# Patient Record
Sex: Female | Born: 1955 | Race: White | Hispanic: No | Marital: Single | Smoking: Never smoker
Health system: Southern US, Community
[De-identification: ages and names within clinical notes are randomized; demographics above are authoritative.]

## PROBLEM LIST (undated history)

## (undated) DIAGNOSIS — J40 Bronchitis, not specified as acute or chronic: Secondary | ICD-10-CM

## (undated) DIAGNOSIS — C801 Malignant (primary) neoplasm, unspecified: Secondary | ICD-10-CM

## (undated) DIAGNOSIS — N200 Calculus of kidney: Secondary | ICD-10-CM

## (undated) DIAGNOSIS — M1990 Unspecified osteoarthritis, unspecified site: Secondary | ICD-10-CM

## (undated) DIAGNOSIS — A159 Respiratory tuberculosis unspecified: Secondary | ICD-10-CM

## (undated) HISTORY — PX: BREAST SURGERY: SHX581

## (undated) HISTORY — PX: WRIST SURGERY: SHX841

## (undated) HISTORY — DX: Respiratory tuberculosis unspecified: A15.9

## (undated) HISTORY — DX: Calculus of kidney: N20.0

## (undated) HISTORY — PX: ABDOMINAL HYSTERECTOMY: SHX81

## (undated) HISTORY — DX: Unspecified osteoarthritis, unspecified site: M19.90

## (undated) HISTORY — DX: Bronchitis, not specified as acute or chronic: J40

## (undated) HISTORY — DX: Malignant (primary) neoplasm, unspecified: C80.1

---

## 1999-11-30 ENCOUNTER — Other Ambulatory Visit: Admission: RE | Admit: 1999-11-30 | Discharge: 1999-11-30 | Payer: Self-pay | Admitting: Gynecology

## 2001-09-02 ENCOUNTER — Emergency Department (HOSPITAL_COMMUNITY): Admission: AC | Admit: 2001-09-02 | Discharge: 2001-09-02 | Payer: Self-pay

## 2001-09-02 ENCOUNTER — Encounter: Payer: Self-pay | Admitting: Emergency Medicine

## 2009-01-06 ENCOUNTER — Encounter: Admission: RE | Admit: 2009-01-06 | Discharge: 2009-01-06 | Payer: Self-pay | Admitting: Infectious Diseases

## 2009-01-13 ENCOUNTER — Encounter (INDEPENDENT_AMBULATORY_CARE_PROVIDER_SITE_OTHER): Payer: Self-pay | Admitting: Diagnostic Radiology

## 2009-01-13 ENCOUNTER — Encounter: Admission: RE | Admit: 2009-01-13 | Discharge: 2009-01-13 | Payer: Self-pay | Admitting: Infectious Diseases

## 2009-01-21 ENCOUNTER — Encounter: Admission: RE | Admit: 2009-01-21 | Discharge: 2009-01-21 | Payer: Self-pay | Admitting: Infectious Diseases

## 2009-01-27 ENCOUNTER — Encounter: Admission: RE | Admit: 2009-01-27 | Discharge: 2009-01-27 | Payer: Self-pay | Admitting: General Surgery

## 2009-01-31 ENCOUNTER — Encounter: Admission: RE | Admit: 2009-01-31 | Discharge: 2009-01-31 | Payer: Self-pay | Admitting: General Surgery

## 2009-01-31 ENCOUNTER — Encounter (INDEPENDENT_AMBULATORY_CARE_PROVIDER_SITE_OTHER): Payer: Self-pay | Admitting: Diagnostic Radiology

## 2009-02-11 ENCOUNTER — Ambulatory Visit (HOSPITAL_COMMUNITY): Admission: RE | Admit: 2009-02-11 | Discharge: 2009-02-12 | Payer: Self-pay | Admitting: General Surgery

## 2009-02-11 ENCOUNTER — Encounter (INDEPENDENT_AMBULATORY_CARE_PROVIDER_SITE_OTHER): Payer: Self-pay | Admitting: General Surgery

## 2009-02-11 ENCOUNTER — Encounter: Admission: RE | Admit: 2009-02-11 | Discharge: 2009-02-11 | Payer: Self-pay | Admitting: General Surgery

## 2009-03-17 ENCOUNTER — Ambulatory Visit (HOSPITAL_BASED_OUTPATIENT_CLINIC_OR_DEPARTMENT_OTHER): Admission: RE | Admit: 2009-03-17 | Discharge: 2009-03-18 | Payer: Self-pay | Admitting: General Surgery

## 2009-03-17 ENCOUNTER — Encounter (INDEPENDENT_AMBULATORY_CARE_PROVIDER_SITE_OTHER): Payer: Self-pay | Admitting: General Surgery

## 2009-03-24 ENCOUNTER — Ambulatory Visit: Payer: Self-pay | Admitting: Oncology

## 2009-03-30 LAB — CBC WITH DIFFERENTIAL/PLATELET
BASO%: 0.2 % (ref 0.0–2.0)
LYMPH%: 20.3 % (ref 14.0–49.7)
MCHC: 34.3 g/dL (ref 31.5–36.0)
MCV: 91.6 fL (ref 79.5–101.0)
MONO#: 0.5 10*3/uL (ref 0.1–0.9)
MONO%: 7 % (ref 0.0–14.0)
Platelets: 196 10*3/uL (ref 145–400)
RBC: 3.49 10*6/uL — ABNORMAL LOW (ref 3.70–5.45)
RDW: 12.7 % (ref 11.2–14.5)
WBC: 6.5 10*3/uL (ref 3.9–10.3)

## 2009-03-31 LAB — COMPREHENSIVE METABOLIC PANEL
ALT: 13 U/L (ref 0–35)
Alkaline Phosphatase: 69 U/L (ref 39–117)
Potassium: 4.6 mEq/L (ref 3.5–5.3)
Sodium: 142 mEq/L (ref 135–145)
Total Bilirubin: 0.6 mg/dL (ref 0.3–1.2)
Total Protein: 6.8 g/dL (ref 6.0–8.3)

## 2009-04-06 ENCOUNTER — Ambulatory Visit: Admission: RE | Admit: 2009-04-06 | Discharge: 2009-06-08 | Payer: Self-pay | Admitting: Radiation Oncology

## 2009-04-08 ENCOUNTER — Encounter: Admission: RE | Admit: 2009-04-08 | Discharge: 2009-04-08 | Payer: Self-pay | Admitting: Oncology

## 2009-04-14 ENCOUNTER — Ambulatory Visit: Payer: Self-pay | Admitting: Family Medicine

## 2009-04-14 DIAGNOSIS — Z853 Personal history of malignant neoplasm of breast: Secondary | ICD-10-CM

## 2009-06-21 ENCOUNTER — Ambulatory Visit: Payer: Self-pay | Admitting: Oncology

## 2009-06-23 LAB — CBC & DIFF AND RETIC
Eosinophils Absolute: 0.2 10*3/uL (ref 0.0–0.5)
MONO#: 0.3 10*3/uL (ref 0.1–0.9)
MONO%: 9.6 % (ref 0.0–14.0)
NEUT#: 1.7 10*3/uL (ref 1.5–6.5)
RBC: 3.87 10*6/uL (ref 3.70–5.45)
RDW: 12.3 % (ref 11.2–14.5)
Retic %: 0.85 % (ref 0.50–1.50)
Retic Ct Abs: 32.9 10*3/uL (ref 18.30–72.70)
WBC: 3.5 10*3/uL — ABNORMAL LOW (ref 3.9–10.3)

## 2009-06-23 LAB — CHCC SMEAR

## 2009-06-24 LAB — FERRITIN: Ferritin: 29 ng/mL (ref 10–291)

## 2009-06-24 LAB — IRON AND TIBC: Iron: 43 ug/dL (ref 42–145)

## 2009-09-08 ENCOUNTER — Ambulatory Visit: Payer: Self-pay | Admitting: Genetic Counselor

## 2009-12-23 ENCOUNTER — Ambulatory Visit: Payer: Self-pay | Admitting: Oncology

## 2009-12-28 LAB — CBC & DIFF AND RETIC
Basophils Absolute: 0 10*3/uL (ref 0.0–0.1)
EOS%: 5.4 % (ref 0.0–7.0)
Eosinophils Absolute: 0.3 10*3/uL (ref 0.0–0.5)
HGB: 12.6 g/dL (ref 11.6–15.9)
LYMPH%: 29.9 % (ref 14.0–49.7)
MCH: 31 pg (ref 25.1–34.0)
MCV: 92.1 fL (ref 79.5–101.0)
MONO%: 7.2 % (ref 0.0–14.0)
NEUT#: 3 10*3/uL (ref 1.5–6.5)
Platelets: 163 10*3/uL (ref 145–400)
RBC: 4.07 10*6/uL (ref 3.70–5.45)
RDW: 12.2 % (ref 11.2–14.5)
Retic %: 0.99 % (ref 0.50–1.50)

## 2009-12-28 LAB — COMPREHENSIVE METABOLIC PANEL
Albumin: 4 g/dL (ref 3.5–5.2)
BUN: 9 mg/dL (ref 6–23)
CO2: 32 mEq/L (ref 19–32)
Calcium: 9.5 mg/dL (ref 8.4–10.5)
Glucose, Bld: 89 mg/dL (ref 70–99)
Potassium: 4 mEq/L (ref 3.5–5.3)
Sodium: 141 mEq/L (ref 135–145)
Total Protein: 7 g/dL (ref 6.0–8.3)

## 2009-12-28 LAB — LACTATE DEHYDROGENASE: LDH: 146 U/L (ref 94–250)

## 2009-12-28 LAB — IRON AND TIBC
Iron: 68 ug/dL (ref 42–145)
UIBC: 272 ug/dL

## 2009-12-28 LAB — FERRITIN: Ferritin: 40 ng/mL (ref 10–291)

## 2010-01-27 ENCOUNTER — Ambulatory Visit: Payer: Self-pay | Admitting: Oncology

## 2010-11-28 ENCOUNTER — Telehealth: Payer: Self-pay | Admitting: Family Medicine

## 2010-11-28 ENCOUNTER — Ambulatory Visit
Admission: RE | Admit: 2010-11-28 | Discharge: 2010-11-28 | Payer: Self-pay | Source: Home / Self Care | Attending: Family Medicine | Admitting: Family Medicine

## 2010-11-28 DIAGNOSIS — Z87448 Personal history of other diseases of urinary system: Secondary | ICD-10-CM | POA: Insufficient documentation

## 2010-11-28 DIAGNOSIS — M549 Dorsalgia, unspecified: Secondary | ICD-10-CM | POA: Insufficient documentation

## 2010-11-28 DIAGNOSIS — N209 Urinary calculus, unspecified: Secondary | ICD-10-CM | POA: Insufficient documentation

## 2010-11-28 LAB — CONVERTED CEMR LAB
Nitrite: NEGATIVE
Specific Gravity, Urine: 1.025
WBC Urine, dipstick: NEGATIVE
pH: 6

## 2010-11-30 ENCOUNTER — Ambulatory Visit
Admission: RE | Admit: 2010-11-30 | Discharge: 2010-11-30 | Payer: Self-pay | Source: Home / Self Care | Attending: Family Medicine | Admitting: Family Medicine

## 2010-11-30 LAB — CONVERTED CEMR LAB
Nitrite: NEGATIVE
Specific Gravity, Urine: 1.02
Urobilinogen, UA: 0.2

## 2010-12-07 ENCOUNTER — Telehealth: Payer: Self-pay | Admitting: Family Medicine

## 2010-12-07 DIAGNOSIS — M549 Dorsalgia, unspecified: Secondary | ICD-10-CM

## 2010-12-07 NOTE — Assessment & Plan Note (Signed)
Summary: Recheck/cb/pt rescd//ccm   Vital Signs:  Patient profile:   55 year old female Menstrual status:  hysterectomy Weight:      128 pounds Temp:     98.9 degrees F oral BP sitting:   120 / 80  (left arm) Cuff size:   regular  Vitals Entered By: Kern Reap CMA Duncan Dull) (November 30, 2010 11:38 AM) CC: follow-up visit   CC:  follow-up visit.  History of Present Illness: Candace Carlson is a 55 year old, married female, who comes in today for follow-up of back pain.  When we saw her initially she had hematuria.  We felt that she also had in addition to some lumbar disease.  A small kidney stone.  She's been drinking lots of liquids.  Urinalysis clear.  Her back pain has persisted.  It comes and goes.  It occurs about 4 to 56 times per day and lasts anywhere from 15 to 45 minutes.  She states when it comes on it.  The combination of sharp and dull, and she points to the left SI area as the source for pain.  Intensity wise.  She says is a 10.  It radiates down the left lateral side of her leg to her calf.  Neurologic review of systems negative.  She states the only other time.  She had pain like this was when she was pregnant.  One she delivered the pain when away,. but the pain was very similar to what she is having now   Allergies: No Known Drug Allergies  Social History: Reviewed history from 04/14/2009 and no changes required. Occupation: Conservation officer, nature, [employer] Divorced Never Smoked Alcohol use-no Drug use-no  Review of Systems      See HPI  Physical Exam  General:  Well-developed,well-nourished,in no acute distress; alert,appropriate and cooperative throughout examination Msk:  No deformity or scoliosis noted of thoracic or lumbar spine.   Pulses:  R and L carotid,radial,femoral,dorsalis pedis and posterior tibial pulses are full and equal bilaterally Extremities:  No clubbing, cyanosis, edema, or deformity noted with normal full range of motion of all joints.   Neurologic:  No  cranial nerve deficits noted. Station and gait are normal. Plantar reflexes are down-going bilaterally. DTRs are symmetrical throughout. Sensory, motor and coordinative functions appear intact.   Impression & Recommendations:  Problem # 1:  BACK PAIN (ICD-724.5) Assessment Unchanged  The following medications were removed from the medication list:    Hydrocodone-acetaminophen 5-500 Mg Tabs (Hydrocodone-acetaminophen) .Marland Kitchen... Take one to two tabs every four hours as needed pain Her updated medication list for this problem includes:    Vicodin Es 7.5-750 Mg Tabs (Hydrocodone-acetaminophen) .Marland Kitchen... 1/2 to 1 by mouth qid as needed pain  Orders: T-Lumbar Spine w/Flex & Ext 4 Views (21308MV) Physical Therapy Referral (PT)  Problem # 2:  HEMATURIA, HX OF (ICD-V13.09) Assessment: Improved  Orders: UA Dipstick w/o Micro (automated)  (81003)  Complete Medication List: 1)  Vicodin Es 7.5-750 Mg Tabs (Hydrocodone-acetaminophen) .... 1/2 to 1 by mouth qid as needed pain  Patient Instructions: 1)   take 600 mg of Motrin twice daily with food, and a half of a Vicodin p.r.n. for severe back pain. 2)  Go to the main office now for some x-rays of the spine. 3)  We will get she set up for physical therapy ASAP. 4)  Return to see me in two weeks for follow-up   Orders Added: 1)  UA Dipstick w/o Micro (automated)  [81003] 2)  T-Lumbar Spine w/Flex & Ext  4 Views [72120TC] 3)  Est. Patient Level III [14782] 4)  Physical Therapy Referral [PT]    Laboratory Results   Urine Tests  Date/Time Recieved: November 30, 2010 11:08 AM  Date/Time Reported: November 30, 2010 11:08 AM   Routine Urinalysis   Color: yellow Appearance: Clear Glucose: negative   (Normal Range: Negative) Bilirubin: negative   (Normal Range: Negative) Ketone: trace (5)   (Normal Range: Negative) Spec. Gravity: 1.020   (Normal Range: 1.003-1.035) Blood: negative   (Normal Range: Negative) pH: 5.0   (Normal Range:  5.0-8.0) Protein: negative   (Normal Range: Negative) Urobilinogen: 0.2   (Normal Range: 0-1) Nitrite: negative   (Normal Range: Negative) Leukocyte Esterace: trace   (Normal Range: Negative)    Comments: Wynona Canes, CMA  November 30, 2010 11:08 AM

## 2010-12-07 NOTE — Assessment & Plan Note (Signed)
Summary: ?uti//ccm/pt rsc/cjr   Vital Signs:  Patient profile:   55 year old female Menstrual status:  hysterectomy Height:      60 inches Weight:      128 pounds BMI:     25.09 Temp:     98.0 degrees F oral BP sitting:   110 / 78  (left arm) Cuff size:   regular  Vitals Entered By: Kern Reap CMA Duncan Dull) (November 28, 2010 12:46 PM) CC: possible uti   CC:  possible uti.  History of Present Illness: Candace Carlson is a 55 year old female, who comes in today for evaluation of back pain.  She states that she awoke last Tuesday morning with left lumbar back pain.  Initially, it was a dull ache on Thursday.  She went to urinate and had some burning sensation in pass some blood.  She now describes the pain as left lumbar constant, sharp, sometimes dull, a 10 on a scale of one to 10 limits at its worst.  It rates down to the left lateral portion of her left leg.  She states this is the same type of pain.  She had many years ago it the end of her pregnancy.  Neurologic review of systems negative,  She's never had a kidney stone in the past.  She states she has recently gone to urgent care for two problems one was a boil in her groin that had to be lanced.  Culture grew out regular staph,,,,,,,, another it was a spider bite on her abdomen.  The resolve with medication  Allergies: No Known Drug Allergies  Past History:  Past medical, surgical, family and social histories (including risk factors) reviewed, and no changes noted (except as noted below).  Past Medical History: Reviewed history from 04/14/2009 and no changes required. Breast cancer, hx of  Past Surgical History: Reviewed history from 04/14/2009 and no changes required. Hysterectomy Mastectomy wrist ganglion  Family History: Reviewed history from 04/14/2009 and no changes required. Father: prostate cancer hx, HTN, DM Mother: HTN, High cholesterol Siblings: 2                1 - RA, spine                1 -  color  blind  Social History: Reviewed history from 04/14/2009 and no changes required. 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 Msk:  No deformity or scoliosis noted of thoracic or lumbar spine.   Pulses:  R and L carotid,radial,femoral,dorsalis pedis and posterior tibial pulses are full and equal bilaterally Extremities:  No clubbing, cyanosis, edema, or deformity noted with normal full range of motion of all joints.   Neurologic:  No cranial nerve deficits noted. Station and gait are normal. Plantar reflexes are down-going bilaterally. DTRs are symmetrical throughout. Sensory, motor and coordinative functions appear intact.   Problems:  Medical Problems Added: 1)  Dx of Back Pain  (ICD-724.5) 2)  Dx of Kidney Stone  (ICD-592.9) 3)  Dx of Hematuria, Hx of  (ICD-V13.09)  Impression & Recommendations:  Problem # 1:  BACK PAIN (ICD-724.5) Assessment New  Her updated medication list for this problem includes:    Hydrocodone-acetaminophen 5-500 Mg Tabs (Hydrocodone-acetaminophen) .Marland Kitchen... Take one to two tabs every  four hours as needed pain    Vicodin Es 7.5-750 Mg Tabs (Hydrocodone-acetaminophen) .Marland Kitchen... 1/2 to 1 by mouth qid as needed pain  Problem # 2:  KIDNEY STONE (ICD-592.9) Assessment: New  Complete Medication List: 1)  Hydrocodone-acetaminophen 5-500 Mg Tabs (Hydrocodone-acetaminophen) .... Take one to two tabs every four hours as needed pain 2)  Penicillin V Potassium 500 Mg Tabs (Penicillin v potassium) .... Take one tab three times a day 3)  Vicodin Es 7.5-750 Mg Tabs (Hydrocodone-acetaminophen) .... 1/2 to 1 by mouth qid as needed pain  Other Orders: UA Dipstick  w/o Micro (manual) (04540)  Patient Instructions: 1)  drink 3 0ounces of water daily. 2)  take Vicodin one half to one tablet every 4 to 6 hours as needed for severe pain. 3)  Rest at home today and tomorrow return on Thursday for follow-up Prescriptions: VICODIN ES 7.5-750 MG TABS (HYDROCODONE-ACETAMINOPHEN) 1/2 to 1 by mouth qid as needed pain  #40 x 1   Entered and Authorized by:   Roderick Pee MD   Signed by:   Roderick Pee MD on 11/28/2010   Method used:   Print then Give to Patient   RxID:   [id number]    Orders Added: 1)  UA Dipstick w/o Micro (manual) [81002] 2)  Est. Patient Level IV [08657]    Laboratory Results   Urine Tests  Date/Time Received: November 28, 2010   Routine Urinalysis   Color: yellow Appearance: Clear Glucose: negative   (Normal Range: Negative) Bilirubin: negative   (Normal Range: Negative) Ketone: negative   (Normal Range: Negative) Spec. Gravity: 1.025   (Normal Range: 1.003-1.035) Blood: trace-lysed   (Normal Range: Negative) pH: 6.0   (Normal Range: 5.0-8.0) Protein: negative   (Normal Range: Negative) Urobilinogen: 0.2   (Normal Range: 0-1) Nitrite: negative   (Normal Range: Negative) Leukocyte Esterace: negative   (Normal Range: Negative)    Comments: Kern Reap CMA Duncan Dull)  November 28, 2010 12:51 PM

## 2010-12-07 NOTE — Progress Notes (Signed)
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    Initial call taken by: Debbra Riding,  November 28, 2010 4:01 PM  Follow-up for Phone Call        Fleet Contras please call Follow-up by: Roderick Pee MD,  November 29, 2010 6:56 AM  Additional Follow-up for Phone Call Additional follow up Details #1::        patient called because she supposed to get a rx for PCN for a kidney stone and [facility] did not receive it. Additional Follow-up by: Kern Reap CMA Duncan Dull),  November 29, 2010 9:11 AM    Additional Follow-up for Phone Call Additional follow up Details #2::    she doesn't need any inATB  for a kidney stone Follow-up by: Roderick Pee MD,  November 29, 2010 9:43 AM  Additional Follow-up for Phone Call Additional follow up Details #3:: Details for Additional Follow-up Action Taken: Pt called to check on status of med. Pls call asap. spoke with patient Additional Follow-up by: Lucy Antigua,  November 29, 2010 10:57 AM

## 2010-12-07 NOTE — Telephone Encounter (Signed)
Pt went to [facility], as Dr Tawanna Cooler Suggested, but too expensive. Pt is req to get a referral to [facility]. Pt said that they would accept a faxed referral. Pls let pt know when this has been done.

## 2010-12-07 NOTE — Telephone Encounter (Signed)
Please advise 

## 2010-12-08 NOTE — Telephone Encounter (Signed)
Fleet Contras please call Erin if it  is a benefit if you have Aurther Loft set  her up and Patrcia Dolly cone  PT

## 2010-12-22 ENCOUNTER — Ambulatory Visit (INDEPENDENT_AMBULATORY_CARE_PROVIDER_SITE_OTHER): Payer: Self-pay | Admitting: Family Medicine

## 2010-12-22 ENCOUNTER — Encounter: Payer: Self-pay | Admitting: Family Medicine

## 2010-12-22 VITALS — BP 102/78 | Temp 98.5°F | Ht 61.25 in | Wt 130.0 lb

## 2010-12-22 DIAGNOSIS — M549 Dorsalgia, unspecified: Secondary | ICD-10-CM

## 2010-12-22 MED ORDER — HYDROCODONE-ACETAMINOPHEN 7.5-750 MG PO TABS: 1.0000 | ORAL_TABLET | Freq: Four times a day (QID) | ORAL | Status: DC | PRN

## 2010-12-22 NOTE — Progress Notes (Signed)
  Subjective:    Patient ID: Candace Carlson, female    DOB: [birth year], 55 y.o.   MRN: [id number]  HPI Candace Carlson is a 55 year old, married female, nonsmoker, who comes in today for follow back pain.  We saw her a couple weeks ago with severe back pain.  Spine films showed no evidence of any tumor, et [name].  She was given some pain medication and advised to start physical therapy.  She had one PT session, which helped a lot, but then she went out of town.  She states her back pain now is constant and 8 on a scale of one to 10 and she is having to take the pain pills or 4 times a day to function.  Thus, the skeletal and neurologic review of systems negative   Review of Systems    Negative Objective:   Physical Exam    Well-developed well-nourished, female, in no acute distress.  Examination of the spine shows no bony tenderness.  In the supine position.  Her strength, sensation, reflexes are normal.  Questions positive straight leg raising left leg at 45 degrees    Assessment & Plan:  Low back pain.  Plan bed rest over the weekend.  Motrin, 600 mg twice daily with food.  Vicodin p.r.n. For pain.  Restart physical therapy return in two weeks for follow-up

## 2010-12-22 NOTE — Patient Instructions (Signed)
Motrin 600 mg twice daily with food.  Vicodin one half tab 4 times a day as needed.  A stay at bed rest, Saturday, and Sunday.  We start physical therapy.  Follow-up with me in two weeks

## 2010-12-28 ENCOUNTER — Ambulatory Visit: Payer: BC Managed Care – PPO

## 2011-01-04 ENCOUNTER — Ambulatory Visit: Payer: Self-pay | Admitting: Family Medicine

## 2011-01-09 ENCOUNTER — Ambulatory Visit: Payer: BC Managed Care – PPO

## 2011-01-09 ENCOUNTER — Ambulatory Visit: Payer: BC Managed Care – PPO | Attending: Family Medicine

## 2011-01-09 DIAGNOSIS — M545 Low back pain, unspecified: Secondary | ICD-10-CM | POA: Insufficient documentation

## 2011-01-09 DIAGNOSIS — M25669 Stiffness of unspecified knee, not elsewhere classified: Secondary | ICD-10-CM | POA: Insufficient documentation

## 2011-01-09 DIAGNOSIS — R5381 Other malaise: Secondary | ICD-10-CM | POA: Insufficient documentation

## 2011-01-09 DIAGNOSIS — M25659 Stiffness of unspecified hip, not elsewhere classified: Secondary | ICD-10-CM | POA: Insufficient documentation

## 2011-01-09 DIAGNOSIS — IMO0001 Reserved for inherently not codable concepts without codable children: Secondary | ICD-10-CM | POA: Insufficient documentation

## 2011-01-15 ENCOUNTER — Ambulatory Visit: Payer: BC Managed Care – PPO | Admitting: Physical Therapy

## 2011-01-17 ENCOUNTER — Ambulatory Visit: Payer: BC Managed Care – PPO

## 2011-01-18 ENCOUNTER — Ambulatory Visit: Payer: Self-pay | Admitting: Family Medicine

## 2011-01-23 ENCOUNTER — Ambulatory Visit (INDEPENDENT_AMBULATORY_CARE_PROVIDER_SITE_OTHER): Payer: BC Managed Care – PPO | Admitting: Family Medicine

## 2011-01-23 ENCOUNTER — Encounter: Payer: Self-pay | Admitting: Family Medicine

## 2011-01-23 VITALS — BP 110/80 | Temp 98.9°F

## 2011-01-23 DIAGNOSIS — M545 Low back pain: Secondary | ICD-10-CM

## 2011-01-23 DIAGNOSIS — M549 Dorsalgia, unspecified: Secondary | ICD-10-CM

## 2011-01-23 MED ORDER — HYDROCODONE-ACETAMINOPHEN 7.5-750 MG PO TABS: ORAL_TABLET | ORAL | Status: DC

## 2011-01-23 NOTE — Progress Notes (Signed)
  Subjective:    Patient ID: Candace Carlson, female    DOB: 1956-01-28, 55 y.o.   MRN: [id number] Candace Carlson is a delightful, 55 year old female, who comes in today for follow-up of left lower back pain.  We saw HER-2 weeks ago for severe back pain.  At that time.  Her neurologic exam is negative.  She's has been going to physical therapy.  She states her pain is about 50% decreased.  Still in the left muscle buttock area.  Musculoskeletal and neurologic review of systems continued to be normal.  She continues to work full-time third shift HPI    Review of Systems Musculoskeletal neurologic, review of systems negative    Objective:   Physical Exam Well-developed well-nourished, female, in no acute distress.  Examination of spines is no bony tenderness.  Neurologic examination lower extremities, including sensation muscle strength, reflexes, and straight leg raising are all negative.       Assessment & Plan:  Lumbar back pain......... Continue PT, pain medicine p.r.n. Return p.r.n.

## 2011-01-23 NOTE — Patient Instructions (Signed)
Continue the physical therapy and home exercises.  Continue the pain pills as needed.  Return p.r.n.

## 2011-02-02 ENCOUNTER — Ambulatory Visit: Payer: BC Managed Care – PPO | Admitting: Physical Therapy

## 2011-02-06 IMAGING — CR DG CHEST 2V
2 series · 2 of 2 positions shown · non-contrast
Comparison: None

CLINICAL DATA: Preoperative respiratory exam for mastectomy.

CHEST - 2 VIEW

[view not recorded (1 of 2)]
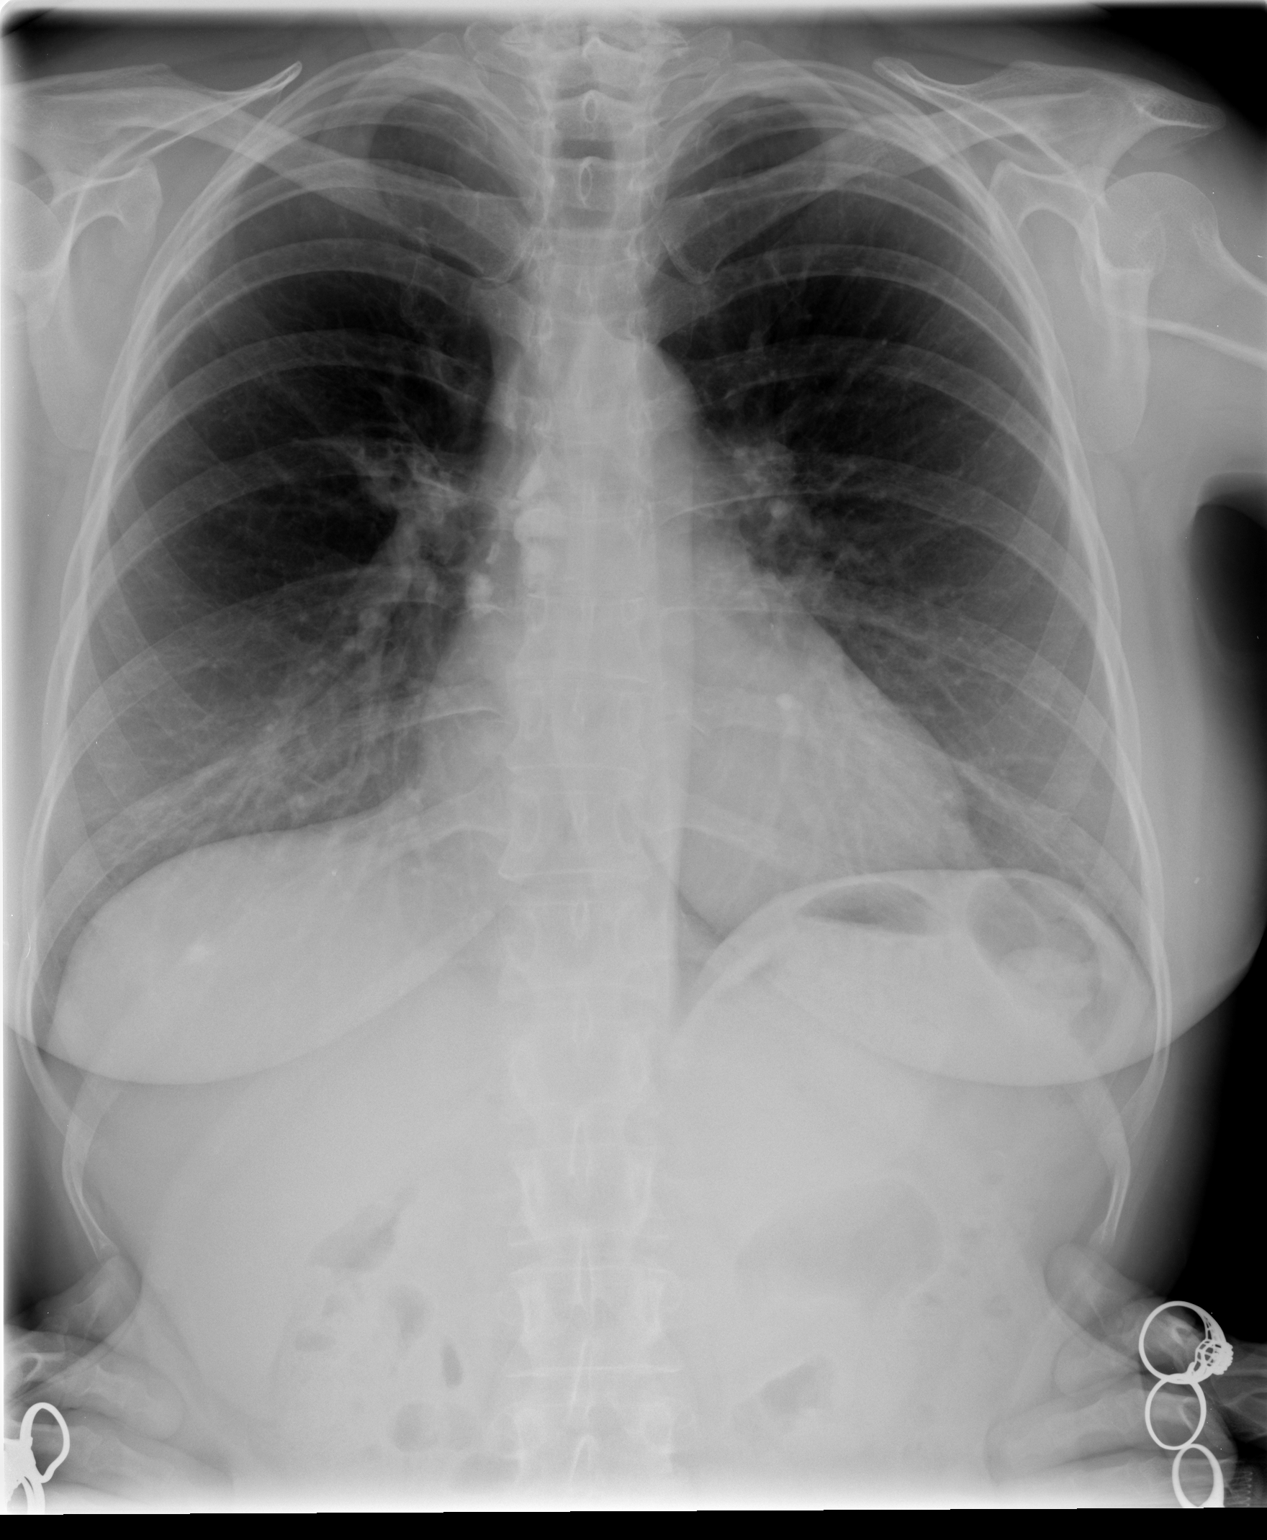

[view not recorded (2 of 2)]
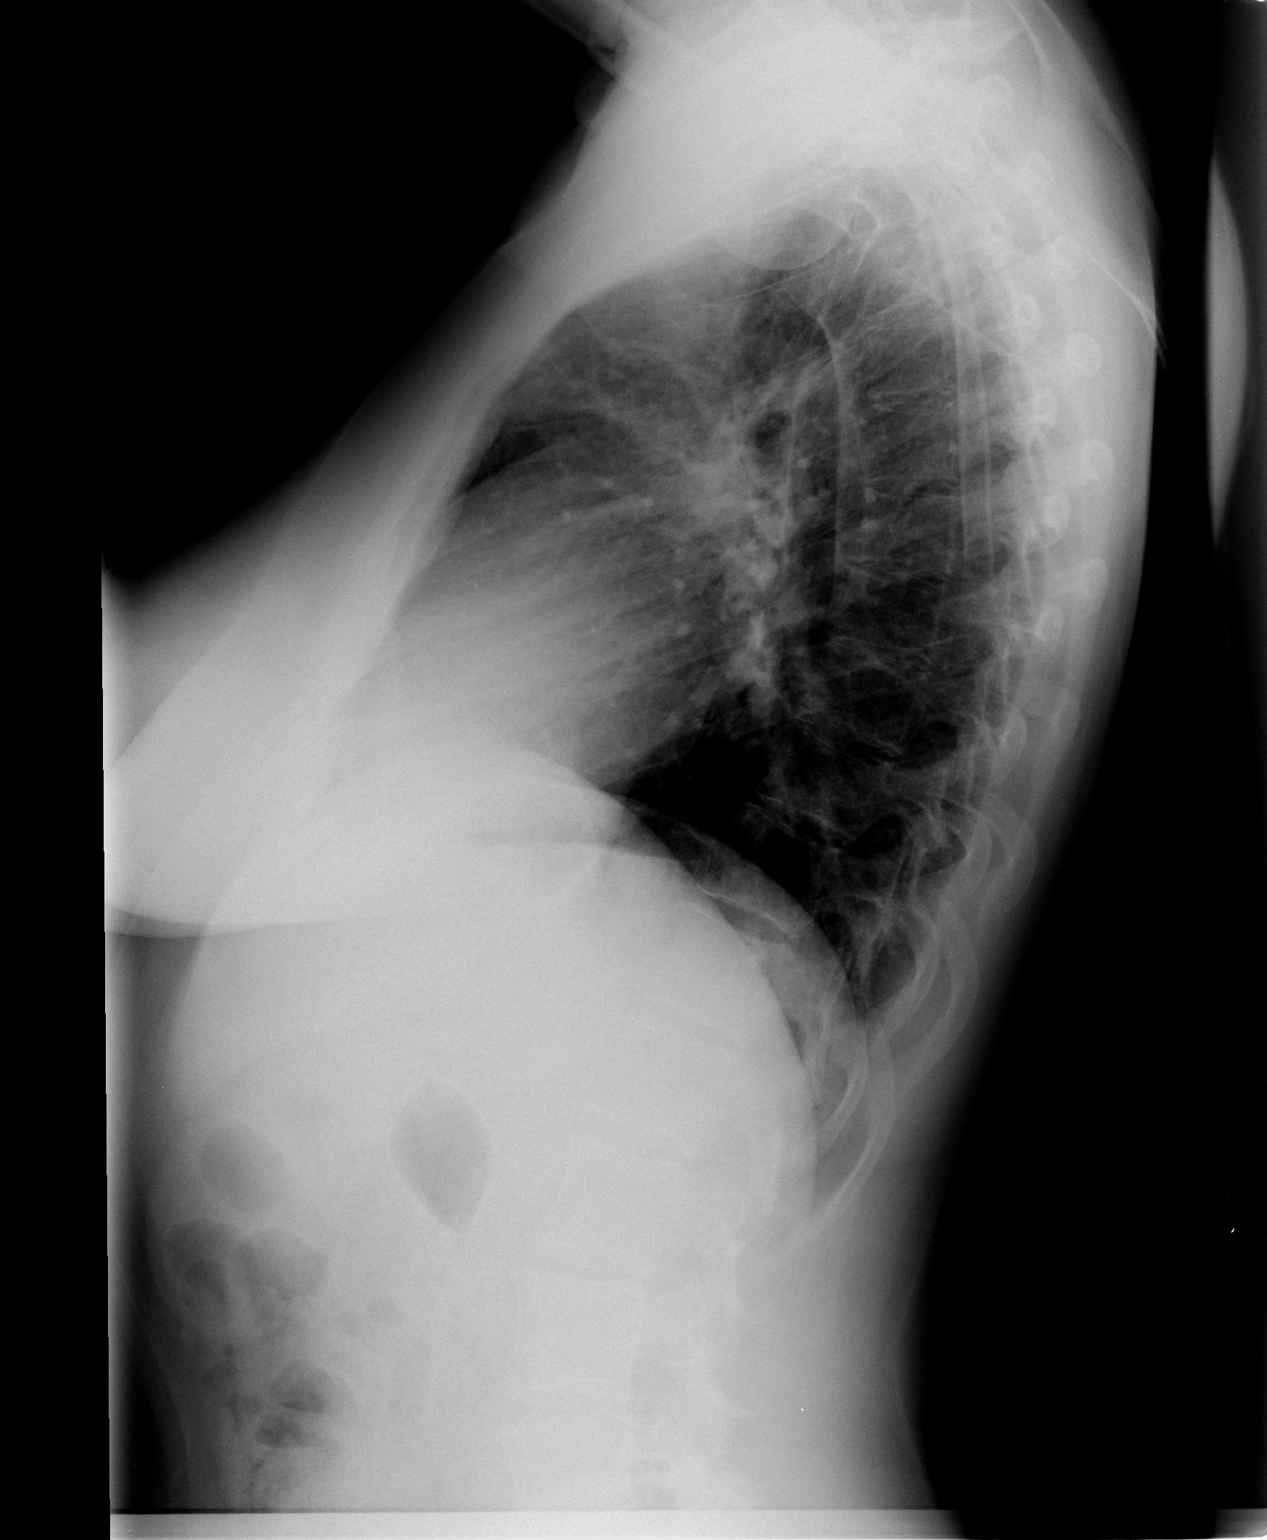

[2 of 2 positions shown; findings below may reference images not displayed]

FINDINGS: Heart size is normal.  There are scattered calcified
granulomas within the lungs.  There are calcified lymph nodes in
the hila and mediastinum.  No evidence of soft tissue mass density,
infiltrate, effusion or collapse.  No significant bony finding.
IMPRESSION: Old granulomatous infection with calcified granulomas and calcified
lymph nodes.  No active process evident.

## 2011-02-13 LAB — CBC
HCT: 34 % — ABNORMAL LOW (ref 36.0–46.0)
MCHC: 34.7 g/dL (ref 30.0–36.0)
MCV: 91.8 fL (ref 78.0–100.0)
Platelets: 133 10*3/uL — ABNORMAL LOW (ref 150–400)
WBC: 4.6 10*3/uL (ref 4.0–10.5)

## 2011-02-13 LAB — DIFFERENTIAL
Basophils Absolute: 0 10*3/uL (ref 0.0–0.1)
Eosinophils Relative: 4 % (ref 0–5)
Lymphocytes Relative: 36 % (ref 12–46)
Monocytes Absolute: 0.3 10*3/uL (ref 0.1–1.0)

## 2011-02-13 LAB — BASIC METABOLIC PANEL
BUN: 9 mg/dL (ref 6–23)
CO2: 29 mEq/L (ref 19–32)
Chloride: 104 mEq/L (ref 96–112)
Creatinine, Ser: 0.68 mg/dL (ref 0.4–1.2)
Glucose, Bld: 120 mg/dL — ABNORMAL HIGH (ref 70–99)
Potassium: 3.8 mEq/L (ref 3.5–5.1)

## 2011-02-14 ENCOUNTER — Ambulatory Visit: Payer: BC Managed Care – PPO | Attending: Family Medicine | Admitting: Physical Therapy

## 2011-02-14 DIAGNOSIS — R5381 Other malaise: Secondary | ICD-10-CM | POA: Insufficient documentation

## 2011-02-14 DIAGNOSIS — M545 Low back pain, unspecified: Secondary | ICD-10-CM | POA: Insufficient documentation

## 2011-02-14 DIAGNOSIS — M25669 Stiffness of unspecified knee, not elsewhere classified: Secondary | ICD-10-CM | POA: Insufficient documentation

## 2011-02-14 DIAGNOSIS — M25659 Stiffness of unspecified hip, not elsewhere classified: Secondary | ICD-10-CM | POA: Insufficient documentation

## 2011-02-14 DIAGNOSIS — IMO0001 Reserved for inherently not codable concepts without codable children: Secondary | ICD-10-CM | POA: Insufficient documentation

## 2011-02-14 LAB — DIFFERENTIAL
Basophils Absolute: 0 10*3/uL (ref 0.0–0.1)
Lymphocytes Relative: 37 % (ref 12–46)
Neutro Abs: 2.6 10*3/uL (ref 1.7–7.7)

## 2011-02-14 LAB — COMPREHENSIVE METABOLIC PANEL
Albumin: 3.8 g/dL (ref 3.5–5.2)
BUN: 11 mg/dL (ref 6–23)
CO2: 29 mEq/L (ref 19–32)
Chloride: 108 mEq/L (ref 96–112)
Creatinine, Ser: 0.59 mg/dL (ref 0.4–1.2)
GFR calc non Af Amer: >60 mL/min (ref >60)
Total Bilirubin: 0.7 mg/dL (ref 0.3–1.2)

## 2011-02-14 LAB — CBC
MCHC: 34.4 g/dL (ref 30.0–36.0)
MCV: 92.1 fL (ref 78.0–100.0)
RBC: 3.87 MIL/uL (ref 3.87–5.11)

## 2011-02-14 LAB — LACTATE DEHYDROGENASE: LDH: 154 U/L (ref 94–250)

## 2011-02-14 LAB — CEA: CEA: 3.9 ng/mL (ref 0.0–5.0)

## 2011-03-08 ENCOUNTER — Other Ambulatory Visit: Payer: Self-pay | Admitting: Family Medicine

## 2011-03-09 NOTE — Telephone Encounter (Signed)
OK  #50 

## 2011-03-20 NOTE — Op Note (Signed)
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  2. Left breast mammographic abnormality with atypia on core biopsy.   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  2. Left breast mammographic abnormality with atypia on core biopsy.   PROCEDURES:  1. Right simple mastectomy.  2. Right axillary sentinel lymph node biopsy.  3. Injection of methylene blue dye of right axillary sentinel lymph      node biopsy.  4. Left breast wire localization biopsy.   SURGEON:  Troy Sine. Dwain Sarna, MD   ASSISTANT:  Gabrielle Dare. Janee Morn, MD   ANESTHESIA:  General.   SPECIMEN:  1. Right breast oriented.  2. Sentinel node, right axilla x3.  3. Nonsentinel node tissue.  4. Left breast biopsy.   ESTIMATED BLOOD LOSS:  200 mL.   DRAINS:  Two 19-French Blake drains to the right mastectomy.   COMPLICATIONS:  None.   DISPOSITION:  To recovery room in stable condition.   INDICATIONS:  Ms. Siple is a 55 year old female, who noted some right  nipple bloody discharge.  She had not had a mammogram in about 8 years.  She went to the [facility], who underwent a diagnostic  mammogram that showed a 7 x 5.5 x 5.8 cm area pleomorphic calcifications  that corresponds to a palpable abnormality.  She also had several areas  on the left side that were evaluated by mammogram and were abnormal.  She underwent a biopsy of the right side showing high-grade DCIS.  She  underwent a biopsy of the left side with a diagnosis ductal hyperplasia  with cytologic atypia.  Another biopsy on the left side show a  fibroadenoma and a benign breast parenchyma.  She and I discussed the  treatment for what is now right breast DCIS and a left breast abnormal  core biopsy.  We decided to perform a right simple mastectomy with  sentinel lymph node biopsy as well as a left breast wire localization  biopsy.  She understands that she may need further surgery pending these  results.  We did discuss having her to see Plastic Surgery, but she  declined that at this time.   PROCEDURE:  After informed consent was obtained, the patient was first  taken to the [facility] where she underwent placement  of a wire in the left breast.  Following this, she was then brought over  to the hospital where she was injected with her radioactive tracer.  Following this, she was then taken to the operating room where she was  placed under general anesthesia without complication.  Sequential  compression devices have been placed on her lower extremities prior to  induction, 1 g  of intravenous cefazolin was also administered.  Her both  breasts and axilla were then prepped with ChloraPrep, 3 minutes was  allowed to pass.  She was then prepped and draped in the standard  sterile surgical fashion.  Surgical time-out was then performed.   I first injected methylene blue in 4 quadrants around the nipple-areolar  complex.  Following this, I then addressed the left side first, had the  mammograms from her wire localization present with me in the operating  room from Dr. Kearney Hard.  I then made a radial incision overlying the wire  and used a cautery, developed flaps in all directions.  I then excised  the wire as well as a surrounding what appeared to be a mass in total  with electrocautery.  This was then passed off the table as a specimen.  Hemostasis was obtained.  This wound was closed with a 3-0 Vicryl to the  dermis and a 4-0 Monocryl in subcuticular fashion.  Steri-Strips and  sterile dressing were then placed.  I  got confirmation from the [facility] that the clip in the area had been appropriately  removed.   Following this, I then proceeded to the other side.   I then made an elliptical incision overlying the mass as well as the  nipple-areolar complex on her right breast.  This was then carried down  to the breast level.  The breast tissue flaps were then created medially  to the level of sternum, inferiorly to the level of the abdominal  musculature, and superiorly to the level of the clavicle.  There was a  fair amount of bleeding around the tumor, which accounts for the 200 mL  of blood loss that had occurred during this procedure.  There was a lot  of vascularity to this tumor and it was difficult to remove from the  skin flaps in the superior location, but I was felt very confident that  these margins were clear and adequate.  I did remove the portion of the  skin that appeared to be involved with the tumor.  Following this, I  then removed the breast from the pectoralis muscle.  There were several  pectoral vessels that I suture ligated with 2-0 Vicryl.  I then removed  the breast from the pectoralis muscle and divided it out towards the  latissimus dorsi.  This was then passed off the table as a specimen.  This was marked with a double stitch deep, single stitch short, the  single stitch short superior, and single stitch long lateral.  Following  this, I then placed the NeoProbe in her axilla, was able to identify 3  sentinel lymph nodes with the highest counts being in the 1200 range.  Two of these sentinel lymph nodes were hot and blue, 1 of these sentinel  lymph nodes was just hot with radioactivity.  There was also some  nonsentinel lymph node tissue that I removed and passed off the table as  specimen as well.  Hemostasis was obtained in all of these areas.  I  then made 2 incisions below the axilla and inserted two 19-French Blake  drains to this area.  These  were sutured into position with a 2-0 nylon  suture.  Following this, I then irrigated and observed hemostasis again.  All of this  appeared clean at that time.  I then closed the dermis with a 3-0 Vicryl  and closed the skin with staples.  Xeroform was  placed over the  incision.  Sterile dressings were placed on both of the wounds.  She  tolerated the procedure well, was extubated in the operating room, and  was transferred to the recovery room in stable condition.      Juanetta Gosling, MD  Electronically Signed     MCW/MEDQ  D:  02/11/2009  T:  02/12/2009  Job:  [id number]   cc:   Dr. Rosalie Gums

## 2011-03-20 NOTE — Op Note (Signed)
[name], Candace Carlson                 ACCOUNT NO.:  [id number]   MEDICAL RECORD NO.:  [id number]          PATIENT TYPE:  AMB   LOCATION:  DSC                          FACILITY:  [facility]   PHYSICIAN:  Juanetta Gosling, MDDATE OF BIRTH:  03-15-1956   DATE OF PROCEDURE:  03/17/2009  DATE OF DISCHARGE:                               OPERATIVE REPORT   PREOPERATIVE DIAGNOSIS:  Left breast ductal carcinoma in situ.   POSTOPERATIVE DIAGNOSIS:  Left breast ductal carcinoma in situ.   PROCEDURES:  1. Left simple mastectomy.  2. Injection of methylene blue dye for sentinel lymph node      identification.  3. Left axillary sentinel lymph node biopsy.   SURGEON:  Troy Sine. Dwain Sarna, MD   ASSISTANT:  Gabrielle Dare. Janee Morn, MD   ANESTHESIA:  General.   SPECIMENS:  1. Left breast.  2. Left axillary sentinel lymph node x2.   ESTIMATED BLOOD LOSS:  Minimal.   COMPLICATIONS:  None.   DRAINS:  Two 19-French Blake drains.   DISPOSITION:  To recovery room in stable condition.   INDICATIONS:  Candace Carlson is well known to me.  She is a 55 year old  female, who underwent a right simple mastectomy and sentinel lymph node  biopsy for DCIS that also had a small amount invasive carcinoma. She  also underwent a left breast wire localization biopsy for a number of  abnormalities seen on MRI and mammogram that showed in situ ductal  carcinoma involving multiple surgical margins of the resection.  She and  I had a long talk about therapy for this including breast conservation  therapy.  Due to a variety of factors, she is elected to also undergo a  left simple mastectomy, which I will also perform a left sentinel lymph  node biopsy at the same time.  We discussed the risks and benefits of  the procedure at length.   PROCEDURE:  After informed consent was obtained, the patient was taken  to the operating room.  She first had sequential compression devices  placed on her lower extremities.  She had 1 g of  cefazolin administered  to her.  She was then placed under general anesthesia with an LMA.  Following this, her left breast and arm were then prepped and draped in  standard sterile surgical fashion.  Following this, a surgical time-out  was then performed.   An elliptical incision encompassing her nipple and areola as well as her  prior biopsy site was then made.  This was carried out down to the level  of breast tissue.  Flaps were raised superiorly to the level of  clavicle, inferiorly to the level of the abdominal musculature, medially  to the level of the sternum, and laterally to the level of the  latissimus dorsi.  Once the flaps were raised, dissection was carried  out down to the muscle.  The breast was then removed from the pectoralis  muscle including the pectoralis fascia.  This was then divided laterally  at the latissimus dorsi.  This was then passed off the table as  specimen.  This was marked and sent to pathology.  The NeoProbe was then inserted into the axilla.  I was able to identify  2 sentinel lymph nodes, the first with a count of 384 and the second  with a count of 120.  The background radioactivity following the removal  of these 2 hot and blue nodes was 8.  Hemostasis was obtained over all  of these areas.  Irrigation was performed.  Two 19-French Blake drains  were inserted underneath the mastectomy flaps.  The dermis was then  closed with multiple interrupted 3-0 Vicryl sutures.  The skin was then  closed with 4-0 Monocryl.  Steri-Strips were then placed over this.  The  drains were working.  Upon completion, a sterile dressing was placed.  She tolerated this well and was transferred to the recovery room in  stable condition.      Juanetta Gosling, MD  Electronically Signed     MCW/MEDQ  D:  03/17/2009  T:  03/17/2009  Job:  [id number]

## 2011-04-10 ENCOUNTER — Encounter (INDEPENDENT_AMBULATORY_CARE_PROVIDER_SITE_OTHER): Payer: Self-pay | Admitting: General Surgery

## 2011-04-20 ENCOUNTER — Telehealth: Payer: Self-pay | Admitting: Family Medicine

## 2011-04-20 ENCOUNTER — Other Ambulatory Visit: Payer: Self-pay | Admitting: Family Medicine

## 2011-04-20 DIAGNOSIS — M549 Dorsalgia, unspecified: Secondary | ICD-10-CM

## 2011-04-20 NOTE — Telephone Encounter (Signed)
Pt needs refill on vicodin call into [facility] in [place] [phone]

## 2011-04-23 NOTE — Telephone Encounter (Signed)
Fleet Contras please call and see why she needs pain medication

## 2011-04-24 MED ORDER — HYDROCODONE-ACETAMINOPHEN 7.5-750 MG PO TABS: ORAL_TABLET | ORAL | Status: DC

## 2011-04-24 NOTE — Telephone Encounter (Signed)
Pt states she is still having pain in lower back and leg.  Completed 6 visits of physical therapy.  Pain is back now that she is no longer doing physical therapy.

## 2011-04-24 NOTE — Telephone Encounter (Signed)
rx called in

## 2011-04-24 NOTE — Telephone Encounter (Signed)
Pt called and sch an ov to see Dr Tawanna Cooler on 04/30/11. Pt also called to check on status of getting pain med called in to [facility] in [facility]

## 2011-04-24 NOTE — Telephone Encounter (Signed)
Since the pain is persistent despite having physical therapy, then we need to see her in the office to talk about what our next options are.  Therefore, she needs to make an appointment.  Vicodin ES dispense 20 tabs,,,,,,,,,, one half or one tablet at bed time p.r.n. For severe pain, no refills

## 2011-04-30 ENCOUNTER — Ambulatory Visit (INDEPENDENT_AMBULATORY_CARE_PROVIDER_SITE_OTHER): Payer: BC Managed Care – PPO | Admitting: Family Medicine

## 2011-04-30 ENCOUNTER — Encounter: Payer: Self-pay | Admitting: Family Medicine

## 2011-04-30 VITALS — BP 108/78 | Temp 98.5°F | Wt 129.0 lb

## 2011-04-30 DIAGNOSIS — M549 Dorsalgia, unspecified: Secondary | ICD-10-CM

## 2011-04-30 MED ORDER — HYDROCODONE-ACETAMINOPHEN 7.5-750 MG PO TABS: ORAL_TABLET | ORAL | Status: DC

## 2011-04-30 NOTE — Patient Instructions (Signed)
Take Motrin, 600 mg twice daily with food, and the pain pills as needed.  We will get her set up for a neurosurgical consult ASAP

## 2011-04-30 NOTE — Progress Notes (Signed)
  Subjective:    Patient ID: Candace Carlson, female    DOB: 04/22/1956, 55 y.o.   MRN: [id number]  HPI  Candace Carlson is a delightful, 55 year old female, who works 3 nights a week at [employer], who comes in today because of recurrent low back pain.  January 2012 she began having left lumbar back pain that radiates down to the middle of her left lower extremity.  She describes the pain is constant, sharp, very sick on a scale of one to 10 sometimes a 5 sometimes at 10.  No bowel nor bladder dysfunction.  We saw her for this problem back in the wintertime.  At that time.  Neurologic exam and x-rays were normal.  We started on physical therapy, which helped a lot, but did not relieve her pain.  Her last PT session was about 6 weeks ago.  Since that, time.  She's been doing home exercises as directed, but the pain does not seem to be getting any better.  She still is able to work 3 nights a week.      Review of Systems General neurologic review of systems otherwise negative.  No bowel or bladder dysfunction.  She's had a hysterectomy    Objective:   Physical Exam    Will develop a nice female with acute pain.  Examination spine shows no palpable tenderness.  Neurologic examination lower extremity shows normal sensation.  Muscle strength and reflexes.  Straight leg raising positive right and left around 75 degrees.    Assessment & Plan:  Lumbar disk disease.  Plan neurosurgical consult to explore other modalities to lessen and stop the pain

## 2011-05-02 ENCOUNTER — Telehealth: Payer: Self-pay | Admitting: *Deleted

## 2011-05-02 NOTE — Telephone Encounter (Signed)
Prescription already given

## 2011-05-02 NOTE — Telephone Encounter (Signed)
[facility] in [place] [phone] sent request for rx refill of vicodin, pt calling to check status

## 2011-07-02 ENCOUNTER — Telehealth: Payer: Self-pay | Admitting: Family Medicine

## 2011-07-02 DIAGNOSIS — M549 Dorsalgia, unspecified: Secondary | ICD-10-CM

## 2011-07-02 NOTE — Telephone Encounter (Signed)
Refill Vicoden until her referral appt. Please send to [facility] in [place]. Thanks.

## 2011-07-02 NOTE — Telephone Encounter (Signed)
Once the date of her consult?????????? So we can determine how much medicine to prescribe her

## 2011-07-03 MED ORDER — HYDROCODONE-ACETAMINOPHEN 7.5-750 MG PO TABS: ORAL_TABLET | ORAL | Status: DC

## 2011-07-24 ENCOUNTER — Ambulatory Visit (INDEPENDENT_AMBULATORY_CARE_PROVIDER_SITE_OTHER): Payer: BC Managed Care – PPO | Admitting: General Surgery

## 2011-07-24 ENCOUNTER — Encounter (INDEPENDENT_AMBULATORY_CARE_PROVIDER_SITE_OTHER): Payer: Self-pay | Admitting: General Surgery

## 2011-07-24 VITALS — BP 112/76 | HR 74 | Temp 97.4°F

## 2011-07-24 DIAGNOSIS — Z853 Personal history of malignant neoplasm of breast: Secondary | ICD-10-CM

## 2011-07-24 NOTE — Progress Notes (Signed)
Subjective:     Patient ID: Candace Carlson, female   DOB: 10/17/1956, 55 y.o.   MRN: [id number]  HPI This is a 69 yof who underwent bilateral simple mastectomies with sentinel node biopsies for stage I right breast cancer, left breast DCIS.  She is doing well without any complaints.  She feels no masses and has no complaints.  She is doing well at work and is performing all her normal activities.  Review of Systems     Objective:   Physical Exam  Constitutional: She appears well-developed and well-nourished.  Pulmonary/Chest:    Lymphadenopathy:    She has no cervical adenopathy.    She has no axillary adenopathy.       Assessment:      History stage I right breast cancer, stage 0 left breast cancer S/p bilateral mastectomies and sentinel node biopsy Plan:     Continue own exams monthly.   I will follow up in six months for exam Call for any questions sooner. We discussed referral for reconstruction again but she will hold off due to money.

## 2011-08-08 ENCOUNTER — Other Ambulatory Visit: Payer: Self-pay | Admitting: Family Medicine

## 2011-08-08 DIAGNOSIS — M549 Dorsalgia, unspecified: Secondary | ICD-10-CM

## 2011-08-08 NOTE — Telephone Encounter (Signed)
Pt req refill of HYDROcodone-acetaminophen (VICODIN ES) 7.5-750 MG per tablet to [facility] in [place], [place]. Pt is completely out of med.

## 2011-08-09 MED ORDER — HYDROCODONE-ACETAMINOPHEN 7.5-750 MG PO TABS: ORAL_TABLET | ORAL | Status: DC

## 2011-08-09 NOTE — Telephone Encounter (Signed)
Pt called back to check on status of getting refill of Hydrocodone called in to [facility] in [place].

## 2011-08-09 NOTE — Telephone Encounter (Signed)
Vicodin ES dispense 40 tablets directions one half tab b.i.d. P.r.n. For back pain, refills x 2

## 2011-11-22 ENCOUNTER — Encounter (INDEPENDENT_AMBULATORY_CARE_PROVIDER_SITE_OTHER): Payer: BC Managed Care – PPO | Admitting: General Surgery

## 2011-12-28 ENCOUNTER — Other Ambulatory Visit: Payer: Self-pay | Admitting: Family Medicine

## 2012-01-28 ENCOUNTER — Encounter (INDEPENDENT_AMBULATORY_CARE_PROVIDER_SITE_OTHER): Payer: BC Managed Care – PPO | Admitting: General Surgery

## 2012-02-21 ENCOUNTER — Encounter (INDEPENDENT_AMBULATORY_CARE_PROVIDER_SITE_OTHER): Payer: Self-pay | Admitting: General Surgery

## 2012-02-21 ENCOUNTER — Ambulatory Visit (INDEPENDENT_AMBULATORY_CARE_PROVIDER_SITE_OTHER): Payer: BC Managed Care – PPO | Admitting: General Surgery

## 2012-02-21 VITALS — BP 126/84 | HR 60 | Temp 98.6°F | Resp 12 | Ht 62.0 in | Wt 115.8 lb

## 2012-02-21 DIAGNOSIS — Z853 Personal history of malignant neoplasm of breast: Secondary | ICD-10-CM

## 2012-02-21 NOTE — Progress Notes (Signed)
Subjective:     Patient ID: Candace Carlson, female   DOB: 12-17-1955, 56 y.o.   MRN: [id number]  HPI This is a 20 yof who underwent bilateral simple mastectomies with sentinel node biopsies for stage I right breast cancer, left breast DCIS. She is doing well without any complaints. She feels no masses and has no complaints. She has no issues with arms or shoulders now.     Review of Systems     Objective:   Physical Exam  Vitals reviewed. Pulmonary/Chest: Right breast exhibits no mass. Left breast exhibits no mass.    Lymphadenopathy:    She has no cervical adenopathy.    She has no axillary adenopathy.       Right: No supraclavicular adenopathy present.       Left: No supraclavicular adenopathy present.       Assessment:     History breast cancer female    Plan:     Continue own exams monthly.  I will follow up in six months for exam  Call for any questions sooner.

## 2012-06-20 ENCOUNTER — Telehealth (INDEPENDENT_AMBULATORY_CARE_PROVIDER_SITE_OTHER): Payer: Self-pay

## 2012-06-20 NOTE — Telephone Encounter (Signed)
LMOM returning pt's call and asked for her to call me back.

## 2012-07-16 ENCOUNTER — Telehealth (INDEPENDENT_AMBULATORY_CARE_PROVIDER_SITE_OTHER): Payer: Self-pay

## 2012-07-16 NOTE — Telephone Encounter (Signed)
Returned pt's call. The pt is asking questions about getting disability for her mastectomy that was done in 2010 b/c of her new job duties at [employer]. 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 which is causing her arm pain after lifting heavy boxes. The pt stated she cant continue to lift heavy items b/c of the pain. I advised pt that with her surgery being three years out there is really no restrictions on the pt at this time I offered her an appt to see Dr Dwain Sarna but she declined for now. The pt stated that she has been having back pain too so she is going to contact her medical doctor and talk to him about disability.

## 2012-08-06 ENCOUNTER — Other Ambulatory Visit: Payer: Self-pay | Admitting: Family Medicine

## 2012-08-11 ENCOUNTER — Telehealth: Payer: Self-pay | Admitting: Family Medicine

## 2012-08-11 NOTE — Telephone Encounter (Signed)
Pt called req refill of HYDROcodone-acetaminophen (VICODIN ES) 7.5-750 MG per tablet   [facility] [place]

## 2012-08-11 NOTE — Telephone Encounter (Signed)
Last seen 04/2011 - no upcoming appt on the books Last written 12/28/11 # [id number] Please advise

## 2012-08-12 ENCOUNTER — Encounter (INDEPENDENT_AMBULATORY_CARE_PROVIDER_SITE_OTHER): Payer: Self-pay | Admitting: General Surgery

## 2012-08-13 NOTE — Telephone Encounter (Signed)
I tried to call Candace Carlson however there is a block on her phone

## 2012-08-13 NOTE — Telephone Encounter (Signed)
Noted - attempt to call also - block

## 2012-08-21 ENCOUNTER — Encounter: Payer: Self-pay | Admitting: Family Medicine

## 2012-08-21 ENCOUNTER — Ambulatory Visit (INDEPENDENT_AMBULATORY_CARE_PROVIDER_SITE_OTHER): Payer: BC Managed Care – PPO | Admitting: Family Medicine

## 2012-08-21 VITALS — BP 122/62 | Temp 98.7°F | Wt 117.0 lb

## 2012-08-21 DIAGNOSIS — M549 Dorsalgia, unspecified: Secondary | ICD-10-CM

## 2012-08-21 MED ORDER — TRAMADOL HCL 50 MG PO TABS: ORAL_TABLET | ORAL | Status: DC

## 2012-08-21 NOTE — Patient Instructions (Signed)
Motrin 600 mg twice daily with food  Tramadol,,,,,,,,,,, 1/2-1 tablet twice daily for pain  X-rays of your back  Followup in one week  Discussed with your daughter stopping childcare

## 2012-08-21 NOTE — Progress Notes (Signed)
  Subjective:    Patient ID: Candace Carlson, female    DOB: 11/05/1956, 56 y.o.   MRN: [id number]  HPI Candace Carlson is a 56 year old female nonsmoker who comes in today for evaluation of low back pain  She states her back pain started about a year ago when she tripped going down the stairs at her daughter's house. Since that time her pain has been constant although it varies in intensity from a tube in the morning to attend at night. She states the pain is central and radiates down to the posterior right calf area. She describes it as sharp sometimes dull. 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 So she never gets to sleep but except for about 5 PM to 9 PM  No history of bowel or bladder problems or trauma   Review of Systems    general and musculoskeletal review of systems otherwise negative Objective:   Physical Exam Well-developed well-nourished female no acute distress examination of the abdomen is normal  The spine is normal there is no bony tenderness. In the supine position both legs are equal length. Sensation reflexes muscle strength all within normal limits. Chest exam shows scars from previous bilateral mastectomies well-healed she elected to go with a prosthesis in each broad as opposed to reconstructive surgery       Assessment & Plan:   low back pain secondary to irritation probably the L5 nerve root  X-ray to be sure there is no anatomic problems or lytic lesions  Motrin 600 mg twice daily  Tylenol 1/2-1 tablet twice daily as needed for pain  Followup in one week

## 2012-08-27 ENCOUNTER — Ambulatory Visit: Payer: BC Managed Care – PPO | Admitting: Family Medicine

## 2012-09-03 ENCOUNTER — Telehealth: Payer: Self-pay | Admitting: Family Medicine

## 2012-09-03 NOTE — Telephone Encounter (Signed)
We received notification this pt did not obtain the spine films ordered 08/21/12. Thank you.

## 2012-11-25 IMAGING — CR DG LUMBAR SPINE COMPLETE W/ BEND
7 series · 7 of 7 positions shown · non-contrast
Comparison: None.

CLINICAL DATA: 54-year-old female with left side low back pain
radiating down the leg times 2 weeks.  No known injury.

LUMBAR SPINE - COMPLETE WITH BENDING VIEWS

[view not recorded (1 of 7)]
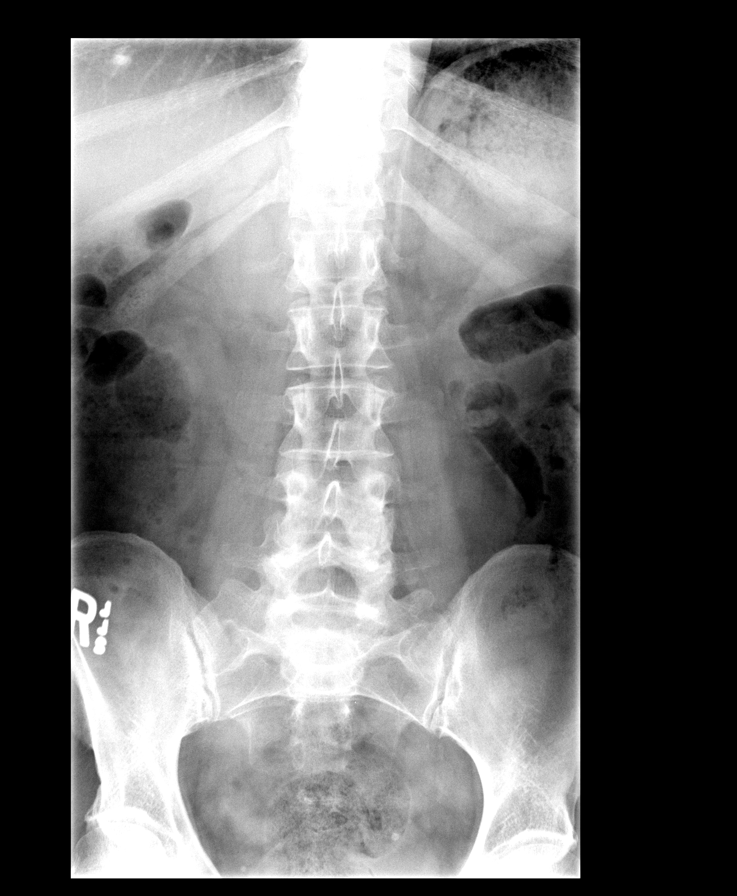

[view not recorded (2 of 7)]
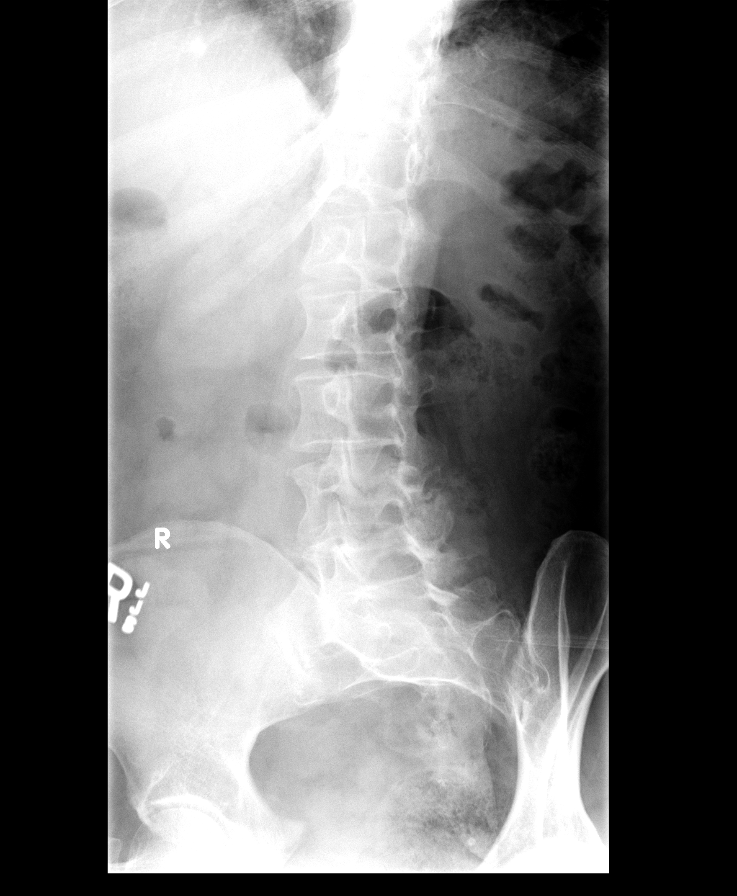

[view not recorded (3 of 7)]
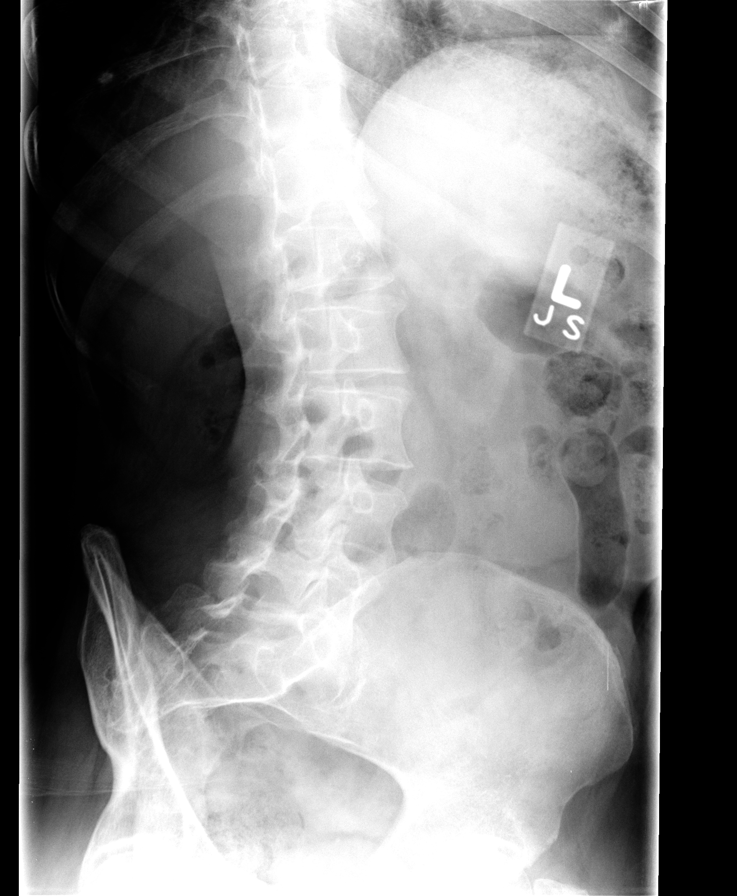

[view not recorded (4 of 7)]
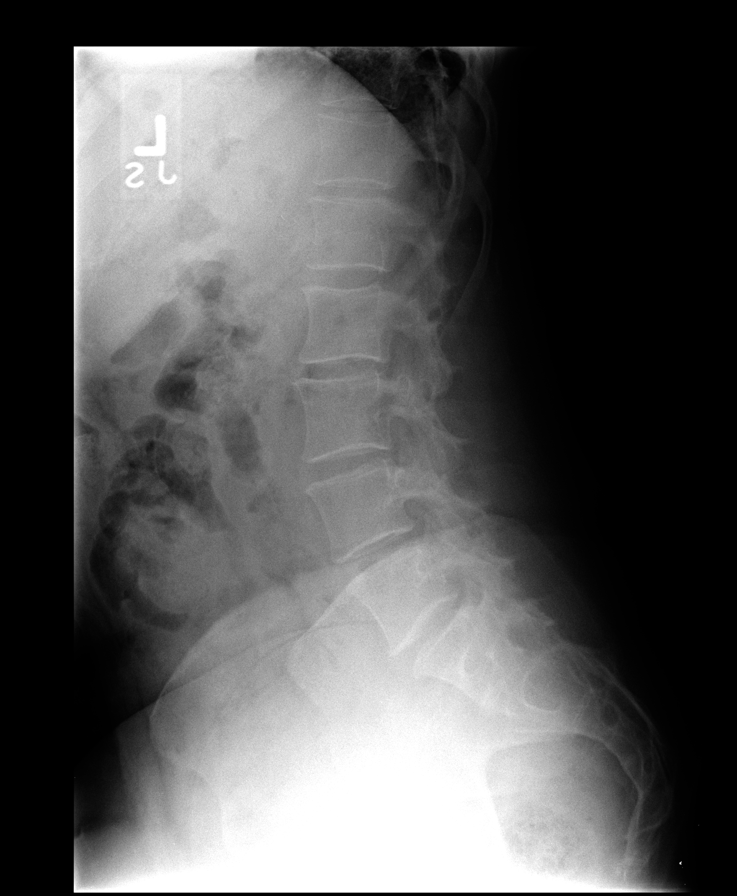

[view not recorded (5 of 7)]
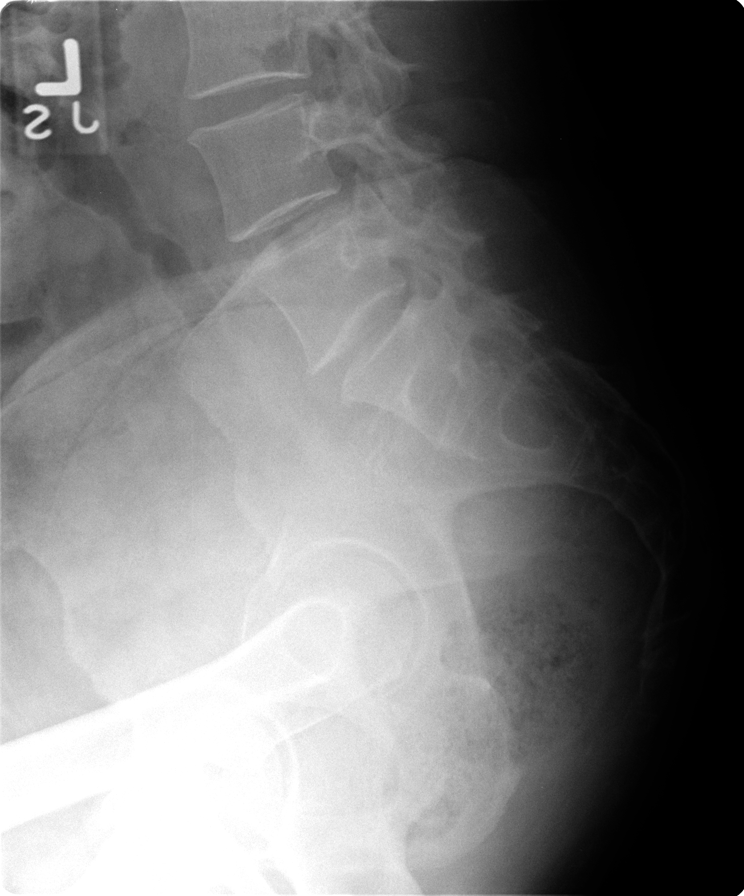

[view not recorded (6 of 7)]
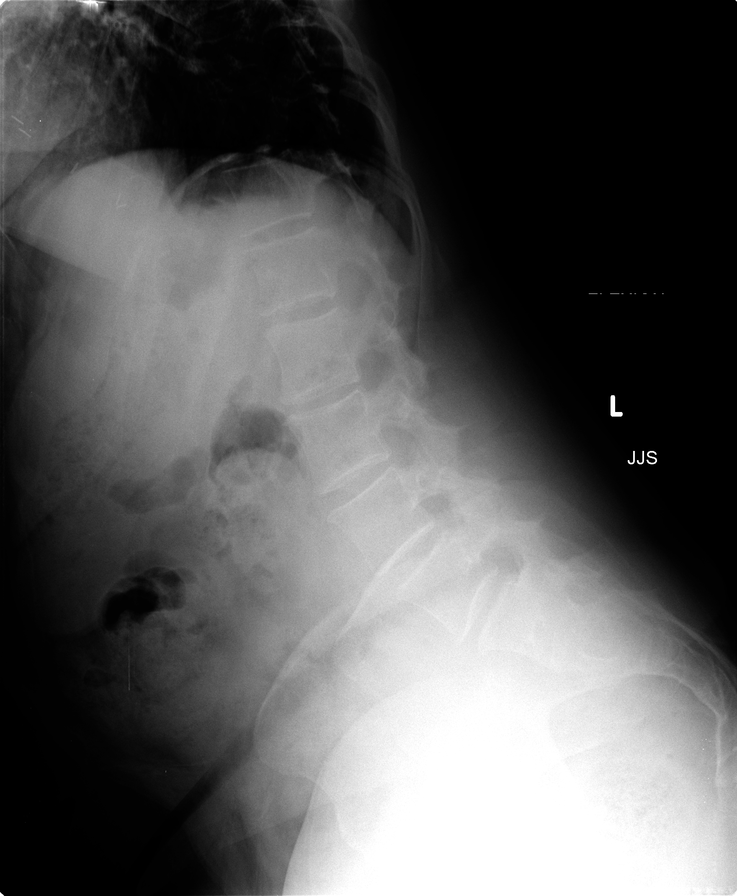

[view not recorded (7 of 7)]
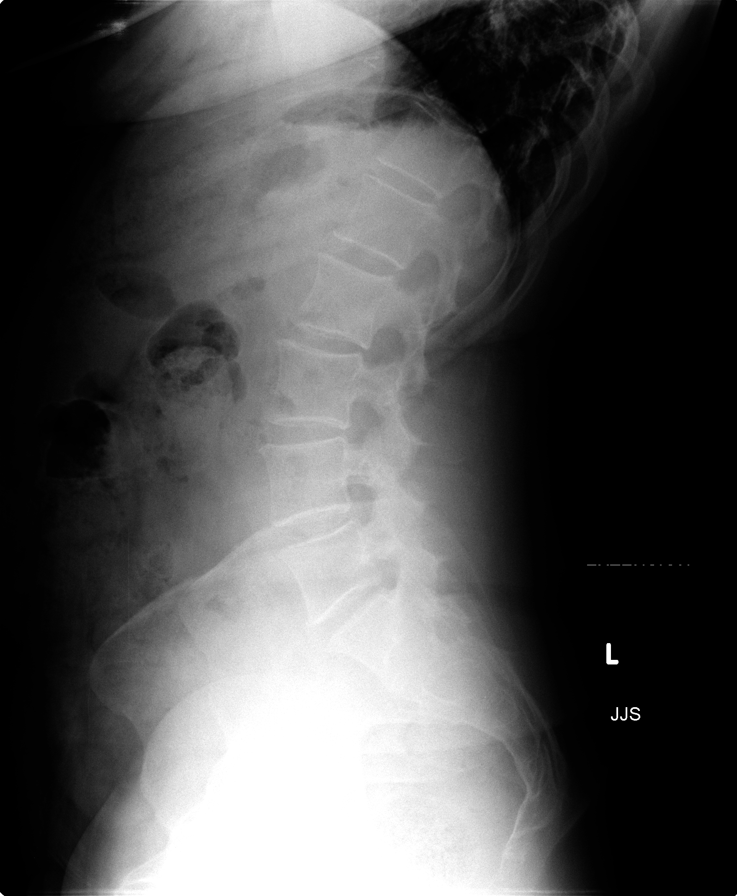

[7 of 7 positions shown; findings below may reference images not displayed]

FINDINGS: The normal lumbar segmentation.  Normal vertebral height
and alignment.  Probable vestigial disc space at S1-S2.  Relatively
preserved lumbar intervertebral disc spaces.  No pars fracture.
Mild lower lumbar facet hypertrophy.  Sacrum and SI joints within
normal limits.

Lateral views provided in flexion and extension.  There does appear
to be mild increased motion at the L2-L3 level, where trace
retrolisthesis is evident.  Lumbar motion elsewhere appears within
normal limits.
IMPRESSION: 1. No acute osseous abnormality in the lumbar spine.
2.  Flexion and extension views raise the possibility of
instability at L2-L3.

## 2013-05-07 ENCOUNTER — Telehealth: Payer: Self-pay | Admitting: Family Medicine

## 2013-05-07 NOTE — Telephone Encounter (Signed)
Pt called and made appt for Tues, 7/7. Encounter closed.

## 2013-05-07 NOTE — Telephone Encounter (Signed)
Patient Information:  Caller Name: Candace Carlson  Phone: [phone]  Patient: Candace Carlson, Candace Carlson  Gender: Female  DOB: 1956-03-20  Age: 57 Years  PCP: [facility])  Office Follow Up:  Does the office need to follow up with this patient?: No  Instructions For The Office: N/A  RN Note:  Pt has been having itermittent headaches w/ nausea, vomiting and diarrhea for last 2 months.  Pt is asymptomatic at time of call, requsting appt week on 7-7.  Pt transferred to office for appt needs.  Symptoms  Reason For Call & Symptoms: Headaches, Nausea, Vomiting w/ Diarrhea, Pt is asymptomatic currently  Reviewed Health History In EMR: N/A  Reviewed Medications In EMR: N/A  Reviewed Allergies In EMR: N/A  Reviewed Surgeries / Procedures: N/A  Date of Onset of Symptoms: 02/04/2013  Treatments Tried: ASA, Ibuprofen, Excedrin,  Treatments Tried Worked: No  Guideline(s) Used:  No Protocol Available - Sick Adult  Disposition Per Guideline:   See Within 2 Weeks in Office  Reason For Disposition Reached:   Nursing judgment  Advice Given:  N/A  Patient Will Follow Care Advice:  YES

## 2013-05-12 ENCOUNTER — Ambulatory Visit (INDEPENDENT_AMBULATORY_CARE_PROVIDER_SITE_OTHER): Payer: BC Managed Care – PPO | Admitting: Family Medicine

## 2013-05-12 ENCOUNTER — Encounter: Payer: Self-pay | Admitting: Family Medicine

## 2013-05-12 VITALS — BP 110/80 | Temp 98.4°F | Wt 118.0 lb

## 2013-05-12 DIAGNOSIS — M549 Dorsalgia, unspecified: Secondary | ICD-10-CM

## 2013-05-12 DIAGNOSIS — Z8669 Personal history of other diseases of the nervous system and sense organs: Secondary | ICD-10-CM | POA: Insufficient documentation

## 2013-05-12 MED ORDER — RIZATRIPTAN BENZOATE 5 MG PO TBDP: 5.0000 mg | ORAL_TABLET | ORAL | Status: DC | PRN

## 2013-05-12 MED ORDER — PREDNISONE 20 MG PO TABS: ORAL_TABLET | ORAL | Status: DC

## 2013-05-12 MED ORDER — TRAMADOL HCL 50 MG PO TABS: ORAL_TABLET | ORAL | Status: DC

## 2013-05-12 NOTE — Progress Notes (Signed)
  Subjective:    Patient ID: Candace Carlson, female    DOB: 1956/09/26, 57 y.o.   MRN: [id number]  HPI Candace Carlson is a delightful 57 year old female nonsmoker who comes in today because of increasing severity and frequency of her migraines  She's had migraine headaches in the past but they've been episodic and she's taken over-the-counter Excedrin. In March they became more severe and more frequent. Now the 3 or 4 times a week. She is in or with a feeling of a funny spot in the back of her right neck and then within 15-20 minutes she gets a headache. No visual or. She's had some nausea and vomiting photophobia and phonophobia recently 2.  Is a lot of stress at work she works at [employer]. Also she works third shift so her sleep cycle is very irregular   Review of Systems Review of systems otherwise negative    Objective:   Physical Exam Well-developed well-nourished female no acute distress vital signs stable she is afebrile neurologic exam was normal       Assessment & Plan:  Cluster migraines........... Prednisone burst and taper,,,,,,,, episodic migraines Maxalt when necessary

## 2013-05-12 NOTE — Patient Instructions (Signed)
Start the prednisone as directed  Maxalt 5 mg MLT,,,,,,,,,,,, 1 immediately when you feel like a migraine might be coming on  Keep a migraine diary  Return in 4 weeks for followup

## 2013-06-09 ENCOUNTER — Ambulatory Visit: Payer: BC Managed Care – PPO | Admitting: Family Medicine

## 2013-06-22 ENCOUNTER — Ambulatory Visit: Payer: BC Managed Care – PPO | Admitting: Internal Medicine

## 2013-06-22 DIAGNOSIS — Z0289 Encounter for other administrative examinations: Secondary | ICD-10-CM

## 2013-06-25 ENCOUNTER — Ambulatory Visit (INDEPENDENT_AMBULATORY_CARE_PROVIDER_SITE_OTHER): Payer: BC Managed Care – PPO | Admitting: Internal Medicine

## 2013-06-25 ENCOUNTER — Encounter: Payer: Self-pay | Admitting: Internal Medicine

## 2013-06-25 VITALS — BP 120/80 | HR 76 | Temp 98.5°F | Wt 126.0 lb

## 2013-06-25 DIAGNOSIS — Z8669 Personal history of other diseases of the nervous system and sense organs: Secondary | ICD-10-CM

## 2013-06-25 NOTE — Progress Notes (Signed)
Subjective:    Patient ID: Candace Carlson, female    DOB: 12/29/1955, 57 y.o.   MRN: [id number]  HPI  57 year old patient who is seen today for followup of migraine headaches. She continues to have headaches with a frequency of one to 2 times per week but responds fairly well to Maxalt and tramadol her regimen. The headaches are greatly alleviated and generally do not recur.  She states that she is able to lie down for a few minutes this regimen is quite effective in limiting her headaches. They usually last 3- 4 hours but are mild after treatment. Past Medical History  Diagnosis Date  . Bronchitis   . Kidney stones   . Arthritis   . 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 Cancer     bilateral breast    History   Social History  . Marital Status: Single    Spouse Name: N/A    Number of Children: N/A  . Years of Education: N/A   Occupational History  . Not on file.   Social History Main Topics  . Smoking status: Never Smoker   . Smokeless tobacco: Not on file  . Alcohol Use: No  . Drug Use: No  . Sexual Activity:    Other Topics Concern  . Not on file   Social History Narrative  . No narrative on file    Past Surgical History  Procedure Laterality Date  . Abdominal hysterectomy    . Wrist surgery    . Breast surgery      bilateral mastectomy with snbx    Family History  Problem Relation Age of Onset  . Hypertension Mother   . Hyperlipidemia Mother   . Cancer Father     prostate  . Hypertension Father   . Hyperlipidemia Father   . Arthritis Other   . Color blindness Other   . Arthritis Brother     No Known Allergies  Current Outpatient Prescriptions on File Prior to Visit  Medication Sig Dispense Refill  . aspirin 81 MG tablet Take 81 mg by mouth daily.        Marland Kitchen ibuprofen (ADVIL,MOTRIN) 100 MG tablet Take 100 mg by mouth every 6 (six) hours as needed.        . rizatriptan (MAXALT-MLT) 5 MG disintegrating tablet Take 1 tablet (5 mg  total) by mouth as needed for migraine. May repeat in 2 hours if needed  10 tablet  10  . traMADol (ULTRAM) 50 MG tablet 1/2-1 tablet twice daily for back pain  60 tablet  3   No current facility-administered medications on file prior to visit.    BP 120/80  Pulse 76  Temp(Src) 98.5 F (36.9 C) (Oral)  Wt 126 lb (57.153 kg)  BMI 23.04 kg/m2  SpO2 97%         Review of Systems  Constitutional: Negative.   HENT: Negative for hearing loss, congestion, sore throat, rhinorrhea, dental problem, sinus pressure and tinnitus.   Eyes: Negative for pain, discharge and visual disturbance.  Respiratory: Negative for cough and shortness of breath.   Cardiovascular: Negative for chest pain, palpitations and leg swelling.  Gastrointestinal: Negative for nausea, vomiting, abdominal pain, diarrhea, constipation, blood in stool and abdominal distention.  Genitourinary: Negative for dysuria, urgency, frequency, hematuria, flank pain, vaginal bleeding, vaginal discharge, difficulty urinating, vaginal pain and pelvic pain.  Musculoskeletal: Negative for joint swelling, arthralgias and gait problem.  Skin: Negative  for rash.  Neurological: Positive for headaches. Negative for dizziness, syncope, speech difficulty, weakness and numbness.  Hematological: Negative for adenopathy.  Psychiatric/Behavioral: Negative for behavioral problems, dysphoric mood and agitation. The patient is not nervous/anxious.        Objective:   Physical Exam  Constitutional: She appears well-developed and well-nourished. No distress.  Psychiatric: She has a normal mood and affect. Her behavior is normal. Judgment and thought content normal.          Assessment & Plan:  Migraine syndrome. Patient is fairly pleased with her current regimen and response to treatment. She'll consider a preventive strategy if headaches worsen or become more frequent

## 2013-06-25 NOTE — Patient Instructions (Addendum)
Migraine Headache A migraine headache is an intense, throbbing pain on one or both sides of your head. A migraine can last for 30 minutes to several hours. CAUSES  The exact cause of a migraine headache is not always known. However, a migraine may be caused when nerves in the brain become irritated and release chemicals that cause inflammation. This causes pain. SYMPTOMS  Pain on one or both sides of your head.  Pulsating or throbbing pain.  Severe pain that prevents daily activities.  Pain that is aggravated by any physical activity.  Nausea, vomiting, or both.  Dizziness.  Pain with exposure to bright lights, loud noises, or activity.  General sensitivity to bright lights, loud noises, or smells. Before you get a migraine, you may get warning signs that a migraine is coming (aura). An aura may include:  Seeing flashing lights.  Seeing bright spots, halos, or zig-zag lines.  Having tunnel vision or blurred vision.  Having feelings of numbness or tingling.  Having trouble talking.  Having muscle weakness. MIGRAINE TRIGGERS  Alcohol.  Smoking.  Stress.  Menstruation.  Aged cheeses.  Foods or drinks that contain nitrates, glutamate, aspartame, or tyramine.  Lack of sleep.  Chocolate.  Caffeine.  Hunger.  Physical exertion.  Fatigue.  Medicines used to treat chest pain (nitroglycerine), birth control pills, estrogen, and some blood pressure medicines. DIAGNOSIS  A migraine headache is often diagnosed based on:  Symptoms.  Physical examination.  A CT scan or MRI of your head. TREATMENT Medicines may be given for pain and nausea. Medicines can also be given to help prevent recurrent migraines.  HOME CARE INSTRUCTIONS  Only take over-the-counter or prescription medicines for pain or discomfort as directed by your caregiver. The use of long-term narcotics is not recommended.  Lie down in a dark, quiet room when you have a migraine.  Keep a journal  to find out what may trigger your migraine headaches. For example, write down:  What you eat and drink.  How much sleep you get.  Any change to your diet or medicines.  Limit alcohol consumption.  Quit smoking if you smoke.  Get 7 to 9 hours of sleep, or as recommended by your caregiver.  Limit stress.  Keep lights dim if bright lights bother you and make your migraines worse. SEEK IMMEDIATE MEDICAL CARE IF:   Your migraine becomes severe.  You have a fever.  You have a stiff neck.  You have vision loss.  You have muscular weakness or loss of muscle control.  You start losing your balance or have trouble walking.  You feel faint or pass out.  You have severe symptoms that are different from your first symptoms. MAKE SURE YOU:   Understand these instructions.  Will watch your condition.  Will get help right away if you are not doing well or get worse. Document Released: 10/22/2005 Document Revised: 01/14/2012 Document Reviewed: 10/12/2011 [facility] Patient Information 2014 [place], [place]. Recurrent Migraine Headache A migraine headache is an intense, throbbing pain on one or both sides of your head. Recurrent migraines keep coming back. A migraine can last for 30 minutes to several hours. CAUSES  The exact cause of a migraine headache is not always known. However, a migraine may be caused when nerves in the brain become irritated and release chemicals that cause inflammation. This causes pain.  SYMPTOMS   Pain on one or both sides of your head.  Pulsating or throbbing pain.  Severe pain that prevents daily activities.  Pain that is aggravated by any physical activity.  Nausea, vomiting, or both.  Dizziness.  Pain with exposure to bright lights, loud noises, or activity.  General sensitivity to bright lights, loud noises, or smells. Before you get a migraine, you may get warning signs that a migraine is coming (aura). An aura may include:  Seeing  flashing lights.  Seeing bright spots, halos, or zig-zag lines.  Having tunnel vision or blurred vision.  Having feelings of numbness or tingling.  Having trouble talking.  Having muscle weakness. MIGRAINE TRIGGERS Examples of triggers of migraine headaches include:   Alcohol.  Smoking.  Stress.  Menstruation.  Aged cheeses.  Foods or drinks that contain nitrates, glutamate, aspartame, or tyramine.  Lack of sleep.  Chocolate.  Caffeine.  Hunger.  Physical exertion.  Fatigue.  Medicines used to treat chest pain (nitroglycerine), birth control pills, estrogen, and some blood pressure medicines. DIAGNOSIS  A recurrent migraine headache is often diagnosed based on:  Symptoms.  Physical examination.  A CT scan or MRI of your head. TREATMENT  Medicines may be given for pain and nausea. Medicines can also be given to help prevent recurrent migraines. HOME CARE INSTRUCTIONS  Only take over-the-counter or prescription medicines for pain or discomfort as directed by your caregiver. The use of long-term narcotics is not recommended.  Lie down in a dark, quiet room when you have a migraine.  Keep a journal to find out what may trigger your migraine headaches. For example, write down:  What you eat and drink.  How much sleep you get.  Any change to your diet or medicines.  Limit alcohol consumption.  Quit smoking if you smoke.  Get 7 to 9 hours of sleep, or as recommended by your caregiver.  Limit stress.  Keep lights dim if bright lights bother you and make your migraines worse. SEEK MEDICAL CARE IF:   You do not get relief from the medicines given to you.  You have a recurrence of pain. SEEK IMMEDIATE MEDICAL CARE IF:  Your migraine becomes severe.  You have a fever.  You have a stiff neck.  You have loss of vision.  You have muscular weakness or loss of muscle control.  You start losing your balance or have trouble walking.  You feel  faint or pass out.  You have severe symptoms that are different from your first symptoms. MAKE SURE YOU:   Understand these instructions.  Will watch your condition.  Will get help right away if you are not doing well or get worse. Document Released: 07/17/2001 Document Revised: 01/14/2012 Document Reviewed: 10/12/2011 [facility] Patient Information 2014 [place], [place].

## 2013-07-03 ENCOUNTER — Telehealth: Payer: Self-pay | Admitting: Family Medicine

## 2013-07-03 DIAGNOSIS — M549 Dorsalgia, unspecified: Secondary | ICD-10-CM

## 2013-07-03 MED ORDER — TRAMADOL HCL 50 MG PO TABS: ORAL_TABLET | ORAL | Status: DC

## 2013-07-03 NOTE — Telephone Encounter (Signed)
Pt needs refill on tramadol 50 mg call into [facility] in [place]

## 2013-07-03 NOTE — Telephone Encounter (Signed)
Spoke to pt told her Rx for Tramadol was called into pharmacy.

## 2013-12-17 ENCOUNTER — Other Ambulatory Visit: Payer: Self-pay | Admitting: Family Medicine

## 2014-02-22 ENCOUNTER — Other Ambulatory Visit: Payer: Self-pay | Admitting: Family Medicine

## 2014-05-27 ENCOUNTER — Telehealth: Payer: Self-pay | Admitting: Family Medicine

## 2014-05-27 MED ORDER — TRAMADOL HCL 50 MG PO TABS: ORAL_TABLET | ORAL | Status: DC

## 2014-05-27 NOTE — Telephone Encounter (Signed)
Pt needs refill on tramadol  50 mg # 60 call into [facility] in [place]

## 2014-06-19 ENCOUNTER — Other Ambulatory Visit: Payer: Self-pay | Admitting: Family Medicine

## 2014-06-30 ENCOUNTER — Other Ambulatory Visit: Payer: Self-pay | Admitting: *Deleted

## 2014-07-01 ENCOUNTER — Ambulatory Visit (INDEPENDENT_AMBULATORY_CARE_PROVIDER_SITE_OTHER): Payer: BC Managed Care – PPO | Admitting: Family Medicine

## 2014-07-01 ENCOUNTER — Encounter: Payer: Self-pay | Admitting: Family Medicine

## 2014-07-01 VITALS — BP 110/70 | Temp 98.7°F | Wt 132.0 lb

## 2014-07-01 DIAGNOSIS — Z8669 Personal history of other diseases of the nervous system and sense organs: Secondary | ICD-10-CM

## 2014-07-01 DIAGNOSIS — Z853 Personal history of malignant neoplasm of breast: Secondary | ICD-10-CM

## 2014-07-01 LAB — POCT URINALYSIS DIPSTICK
Bilirubin, UA: NEGATIVE
Glucose, UA: NEGATIVE
Ketones, UA: NEGATIVE
Leukocytes, UA: NEGATIVE
NITRITE UA: NEGATIVE
PROTEIN UA: NEGATIVE
RBC UA: NEGATIVE
Spec Grav, UA: 1.01
UROBILINOGEN UA: 0.2
pH, UA: 6

## 2014-07-01 LAB — CBC WITH DIFFERENTIAL/PLATELET
BASOS ABS: 0 10*3/uL (ref 0.0–0.1)
Basophils Relative: 0.4 % (ref 0.0–3.0)
EOS ABS: 0.2 10*3/uL (ref 0.0–0.7)
Eosinophils Relative: 4.1 % (ref 0.0–5.0)
HCT: 39.1 % (ref 36.0–46.0)
Hemoglobin: 13 g/dL (ref 12.0–15.0)
Lymphocytes Relative: 37.4 % (ref 12.0–46.0)
Lymphs Abs: 2.2 10*3/uL (ref 0.7–4.0)
MCHC: 33.3 g/dL (ref 30.0–36.0)
MCV: 92.8 fl (ref 78.0–100.0)
MONO ABS: 0.4 10*3/uL (ref 0.1–1.0)
Monocytes Relative: 7.5 % (ref 3.0–12.0)
NEUTROS PCT: 50.6 % (ref 43.0–77.0)
Neutro Abs: 2.9 10*3/uL (ref 1.4–7.7)
PLATELETS: 170 10*3/uL (ref 150.0–400.0)
RBC: 4.21 Mil/uL (ref 3.87–5.11)
RDW: 13 % (ref 11.5–15.5)
WBC: 5.8 10*3/uL (ref 4.0–10.5)

## 2014-07-01 LAB — BASIC METABOLIC PANEL
BUN: 12 mg/dL (ref 6–23)
CHLORIDE: 103 meq/L (ref 96–112)
CO2: 27 mEq/L (ref 19–32)
CREATININE: 0.8 mg/dL (ref 0.4–1.2)
Calcium: 9.6 mg/dL (ref 8.4–10.5)
GFR: 78.18 mL/min (ref >60.00)
GLUCOSE: 81 mg/dL (ref 70–99)
POTASSIUM: 3.4 meq/L — AB (ref 3.5–5.1)
Sodium: 139 mEq/L (ref 135–145)

## 2014-07-01 LAB — HEPATIC FUNCTION PANEL
ALK PHOS: 73 U/L (ref 39–117)
ALT: 16 U/L (ref 0–35)
AST: 22 U/L (ref 0–37)
Albumin: 4.2 g/dL (ref 3.5–5.2)
BILIRUBIN DIRECT: 0 mg/dL (ref 0.0–0.3)
TOTAL PROTEIN: 7.6 g/dL (ref 6.0–8.3)
Total Bilirubin: 0.9 mg/dL (ref 0.2–1.2)

## 2014-07-01 LAB — TSH: TSH: 3.12 u[IU]/mL (ref 0.35–4.50)

## 2014-07-01 MED ORDER — TRAMADOL HCL 50 MG PO TABS: ORAL_TABLET | ORAL | Status: DC

## 2014-07-01 MED ORDER — RIZATRIPTAN BENZOATE 5 MG PO TBDP: ORAL_TABLET | ORAL | Status: DC

## 2014-07-01 NOTE — Patient Instructions (Signed)
   Continue your current treatment program for migraine headaches  Labs today  Set up a 30 minute appointment sometime in the next 4-6 weeks for general physical exam

## 2014-07-01 NOTE — Progress Notes (Signed)
Pre visit review using our clinic review tool, if applicable. No additional management support is needed unless otherwise documented below in the visit note. 

## 2014-07-01 NOTE — Progress Notes (Signed)
   Subjective:    Patient ID: Candace Carlson, female    DOB: 05-04-1956, 58 y.o.   MRN: [id number]  HPI Candace Carlson is a 58 year old female who comes in today for followup of migraine headaches  Her migraine headaches very from 1-3 per week. She takes Maxalt and sometimes a tramadol. We discussed other options including prophylactic medication she declines  Last physical was over a year ago   Review of Systems Review of systems otherwise negative    Objective:   Physical Exam  Well-developed well-nourished female no acute distress vital signs stable she is afebrile      Assessment & Plan:  Migraine headaches under good control with Maxalt.......... offered other prophylactic options....... she declines at this juncture and wishes to continue your current therapy  History of breast cancer with bilateral mastectomies......... followup CPX this fall.

## 2014-07-15 ENCOUNTER — Telehealth: Payer: Self-pay | Admitting: Family Medicine

## 2014-07-15 NOTE — Telephone Encounter (Signed)
Pt needs blood work results °

## 2014-07-15 NOTE — Telephone Encounter (Signed)
Patient is aware of lab results.

## 2014-08-05 ENCOUNTER — Encounter: Payer: BC Managed Care – PPO | Admitting: Family Medicine

## 2014-09-13 ENCOUNTER — Encounter: Payer: BC Managed Care – PPO | Admitting: Family Medicine

## 2014-10-06 ENCOUNTER — Other Ambulatory Visit: Payer: Self-pay | Admitting: Family Medicine

## 2014-10-07 ENCOUNTER — Other Ambulatory Visit: Payer: Self-pay | Admitting: Family Medicine

## 2014-12-10 ENCOUNTER — Other Ambulatory Visit: Payer: Self-pay | Admitting: Family Medicine

## 2015-01-05 ENCOUNTER — Encounter: Payer: Self-pay | Admitting: Family Medicine

## 2015-01-11 ENCOUNTER — Other Ambulatory Visit: Payer: Self-pay | Admitting: Family Medicine

## 2015-01-18 ENCOUNTER — Ambulatory Visit (INDEPENDENT_AMBULATORY_CARE_PROVIDER_SITE_OTHER): Payer: BLUE CROSS/BLUE SHIELD | Admitting: Family Medicine

## 2015-01-18 ENCOUNTER — Encounter: Payer: Self-pay | Admitting: Family Medicine

## 2015-01-18 VITALS — BP 110/80 | Temp 97.3°F | Wt 136.0 lb

## 2015-01-18 DIAGNOSIS — Z8669 Personal history of other diseases of the nervous system and sense organs: Secondary | ICD-10-CM

## 2015-01-18 MED ORDER — TRAMADOL HCL 50 MG PO TABS: ORAL_TABLET | ORAL | Status: DC

## 2015-01-18 MED ORDER — RIZATRIPTAN BENZOATE 5 MG PO TBDP
ORAL_TABLET | ORAL | Status: DC
Start: 2015-01-18 — End: 2015-08-23

## 2015-01-18 MED ORDER — TOPIRAMATE 25 MG PO TABS: 25.0000 mg | ORAL_TABLET | Freq: Two times a day (BID) | ORAL | Status: DC

## 2015-01-18 NOTE — Progress Notes (Signed)
Pre visit review using our clinic review tool, if applicable. No additional management support is needed unless otherwise documented below in the visit note. 

## 2015-01-18 NOTE — Patient Instructions (Signed)
Topamax 25 mg........Marland Kitchen 1 tablet at bedtime   Maxalt 5 mg stat along with the tramadol for recurrent migraines  Keep a migraine diary  Return in 5 weeks for follow-up

## 2015-01-18 NOTE — Progress Notes (Signed)
   Subjective:    Patient ID: Candace Carlson, female    DOB: Apr 01, 1956, 59 y.o.   MRN: [id number]  HPI  Candace Carlson is a 60 year old female who comes in today for evaluation of migraine headaches  She's having about 2 migraine headaches per week. She senses when they come on,  And she is able to take a Maxalt 5 mg and wanted to tramadol and go to sleep however the headaches even though she is postmenopausal occur again once or twice per week.   Review of Systems     Review of systems otherwise negative Objective:   Physical Exam  well-developed well-nourished female no acute distress vital signs stable she's afebrile       Assessment & Plan:   migraine headaches........ Trial of Topamax return in 4 weeks

## 2015-02-22 ENCOUNTER — Ambulatory Visit (INDEPENDENT_AMBULATORY_CARE_PROVIDER_SITE_OTHER): Payer: BLUE CROSS/BLUE SHIELD | Admitting: Family Medicine

## 2015-02-22 ENCOUNTER — Encounter: Payer: Self-pay | Admitting: Family Medicine

## 2015-02-22 VITALS — BP 124/80 | Temp 98.8°F | Wt 137.0 lb

## 2015-02-22 DIAGNOSIS — Z8669 Personal history of other diseases of the nervous system and sense organs: Secondary | ICD-10-CM

## 2015-02-22 MED ORDER — RIZATRIPTAN BENZOATE 10 MG PO TBDP: 10.0000 mg | ORAL_TABLET | ORAL | Status: DC | PRN

## 2015-02-22 MED ORDER — TRAMADOL HCL 50 MG PO TABS: ORAL_TABLET | ORAL | Status: DC

## 2015-02-22 MED ORDER — TOPIRAMATE 50 MG PO TABS: ORAL_TABLET | ORAL | Status: DC

## 2015-02-22 NOTE — Patient Instructions (Signed)
Topamax 50 mg in the morning........ 25 mg at bedtime........ do this for 3 weeks and if your migraines have decreased in severity and frequency maintained that dose of not increase it to 50 mg twice daily  If you get to 50 mg twice daily and your still having a lot of migraines call........Marland Kitchen [id number]  Increase the Maxalt to 10 mg when necessary

## 2015-02-22 NOTE — Progress Notes (Signed)
Pre visit review using our clinic review tool, if applicable. No additional management support is needed unless otherwise documented below in the visit note. 

## 2015-02-22 NOTE — Progress Notes (Signed)
   Subjective:    Patient ID: Candace Carlson, female    DOB: Nov 25, 1955, 59 y.o.   MRN: [id number]  HPI Candace Carlson is a 59 year old female nonsmoker who comes in today for follow-up of migraine headaches because of the frequency and severity of her migraines we started on Topamax 25 mg twice a day. She feels like the decreased in frequency some decrease and severity not much but decrease in frequency.  She forgot to bring her migraine diary   Review of Systems Review of systems otherwise negative no side effects from medication    Objective:   Physical Exam  Well-developed well-nourished female no acute distress vital signs stable she's afebrile      Assessment & Plan:  Migraine headaches....... increase Topamax to 50 mg in the morning and 25 mg at bedtime...Marland KitchenMarland KitchenMarland Kitchen for 3 weeks if after 3 weeks she still having migraines increase it to 50 mg twice daily  Also increase the Maxalt 10 mg daily because she doesn't get very much relief with the 5 mg

## 2015-04-06 ENCOUNTER — Telehealth: Payer: Self-pay | Admitting: *Deleted

## 2015-04-06 NOTE — Telephone Encounter (Signed)
Left a message for the pt to call the office back (need date of recent mammogram).

## 2015-04-06 NOTE — Telephone Encounter (Signed)
S/w pt she said she has had a double mastectomy and does not have Mammograms every year

## 2015-06-06 ENCOUNTER — Encounter: Payer: Self-pay | Admitting: *Deleted

## 2015-08-05 ENCOUNTER — Telehealth: Payer: Self-pay | Admitting: Family Medicine

## 2015-08-05 NOTE — Telephone Encounter (Signed)
Pt has CPE on 10/18.   But pt states she is out of her tramadol and would like a refill sent to   [facility] traMADol (ULTRAM) 50 MG tablet

## 2015-08-06 ENCOUNTER — Other Ambulatory Visit: Payer: Self-pay | Admitting: Family Medicine

## 2015-08-08 ENCOUNTER — Other Ambulatory Visit: Payer: BLUE CROSS/BLUE SHIELD

## 2015-08-08 MED ORDER — TRAMADOL HCL 50 MG PO TABS: ORAL_TABLET | ORAL | Status: DC

## 2015-08-08 NOTE — Telephone Encounter (Signed)
Rx called in 

## 2015-08-15 ENCOUNTER — Encounter: Payer: BLUE CROSS/BLUE SHIELD | Admitting: Family Medicine

## 2015-08-16 ENCOUNTER — Other Ambulatory Visit (INDEPENDENT_AMBULATORY_CARE_PROVIDER_SITE_OTHER): Payer: BLUE CROSS/BLUE SHIELD

## 2015-08-16 DIAGNOSIS — Z0000 Encounter for general adult medical examination without abnormal findings: Secondary | ICD-10-CM

## 2015-08-16 LAB — BASIC METABOLIC PANEL
BUN: 11 mg/dL (ref 6–23)
CHLORIDE: 103 meq/L (ref 96–112)
CO2: 28 mEq/L (ref 19–32)
Calcium: 9.9 mg/dL (ref 8.4–10.5)
Creatinine, Ser: 0.81 mg/dL (ref 0.40–1.20)
GFR: 76.77 mL/min (ref >60.00)
GLUCOSE: 83 mg/dL (ref 70–99)
Potassium: 4.6 mEq/L (ref 3.5–5.1)
Sodium: 141 mEq/L (ref 135–145)

## 2015-08-16 LAB — HEPATIC FUNCTION PANEL
ALK PHOS: 70 U/L (ref 39–117)
ALT: 14 U/L (ref 0–35)
AST: 16 U/L (ref 0–37)
Albumin: 4.3 g/dL (ref 3.5–5.2)
BILIRUBIN DIRECT: 0.1 mg/dL (ref 0.0–0.3)
TOTAL PROTEIN: 7.6 g/dL (ref 6.0–8.3)
Total Bilirubin: 0.6 mg/dL (ref 0.2–1.2)

## 2015-08-16 LAB — POCT URINALYSIS DIPSTICK
Bilirubin, UA: NEGATIVE
Blood, UA: NEGATIVE
GLUCOSE UA: NEGATIVE
Ketones, UA: NEGATIVE
Leukocytes, UA: NEGATIVE
Nitrite, UA: NEGATIVE
Protein, UA: NEGATIVE
SPEC GRAV UA: 1.01
UROBILINOGEN UA: 0.2
pH, UA: 6

## 2015-08-16 LAB — CBC WITH DIFFERENTIAL/PLATELET
BASOS ABS: 0 10*3/uL (ref 0.0–0.1)
Basophils Relative: 0.3 % (ref 0.0–3.0)
EOS ABS: 0.2 10*3/uL (ref 0.0–0.7)
Eosinophils Relative: 3.4 % (ref 0.0–5.0)
HEMATOCRIT: 38 % (ref 36.0–46.0)
HEMOGLOBIN: 12.8 g/dL (ref 12.0–15.0)
LYMPHS PCT: 32.7 % (ref 12.0–46.0)
Lymphs Abs: 2 10*3/uL (ref 0.7–4.0)
MCHC: 33.7 g/dL (ref 30.0–36.0)
MCV: 92 fl (ref 78.0–100.0)
Monocytes Absolute: 0.4 10*3/uL (ref 0.1–1.0)
Monocytes Relative: 7 % (ref 3.0–12.0)
NEUTROS ABS: 3.5 10*3/uL (ref 1.4–7.7)
Neutrophils Relative %: 56.6 % (ref 43.0–77.0)
PLATELETS: 175 10*3/uL (ref 150.0–400.0)
RBC: 4.13 Mil/uL (ref 3.87–5.11)
RDW: 12.8 % (ref 11.5–15.5)
WBC: 6.1 10*3/uL (ref 4.0–10.5)

## 2015-08-16 LAB — LIPID PANEL
CHOLESTEROL: 269 mg/dL — AB (ref 0–200)
HDL: 44.1 mg/dL (ref >39.00)
LDL CALC: 192 mg/dL — AB (ref 0–99)
NonHDL: 225.04
TRIGLYCERIDES: 163 mg/dL — AB (ref 0.0–149.0)
Total CHOL/HDL Ratio: 6
VLDL: 32.6 mg/dL (ref 0.0–40.0)

## 2015-08-16 LAB — TSH: TSH: 13.24 u[IU]/mL — ABNORMAL HIGH (ref 0.35–4.50)

## 2015-08-23 ENCOUNTER — Encounter: Payer: Self-pay | Admitting: Family Medicine

## 2015-08-23 ENCOUNTER — Ambulatory Visit (INDEPENDENT_AMBULATORY_CARE_PROVIDER_SITE_OTHER): Payer: BLUE CROSS/BLUE SHIELD | Admitting: Family Medicine

## 2015-08-23 VITALS — BP 110/80 | Temp 98.2°F | Ht 61.0 in | Wt 133.0 lb

## 2015-08-23 DIAGNOSIS — Z8669 Personal history of other diseases of the nervous system and sense organs: Secondary | ICD-10-CM | POA: Diagnosis not present

## 2015-08-23 DIAGNOSIS — Z853 Personal history of malignant neoplasm of breast: Secondary | ICD-10-CM

## 2015-08-23 DIAGNOSIS — Z23 Encounter for immunization: Secondary | ICD-10-CM | POA: Diagnosis not present

## 2015-08-23 DIAGNOSIS — E7889 Other lipoprotein metabolism disorders: Secondary | ICD-10-CM

## 2015-08-23 DIAGNOSIS — Z0000 Encounter for general adult medical examination without abnormal findings: Secondary | ICD-10-CM | POA: Diagnosis not present

## 2015-08-23 DIAGNOSIS — R7989 Other specified abnormal findings of blood chemistry: Secondary | ICD-10-CM | POA: Insufficient documentation

## 2015-08-23 MED ORDER — TOPIRAMATE 25 MG PO TABS: 25.0000 mg | ORAL_TABLET | Freq: Two times a day (BID) | ORAL | Status: DC

## 2015-08-23 MED ORDER — RIZATRIPTAN BENZOATE 10 MG PO TBDP: 10.0000 mg | ORAL_TABLET | ORAL | Status: DC | PRN

## 2015-08-23 MED ORDER — TRAMADOL HCL 50 MG PO TABS: ORAL_TABLET | ORAL | Status: DC

## 2015-08-23 MED ORDER — TOPIRAMATE 50 MG PO TABS: ORAL_TABLET | ORAL | Status: DC

## 2015-08-23 NOTE — Progress Notes (Signed)
Pre visit review using our clinic review tool, if applicable. No additional management support is needed unless otherwise documented below in the visit note. 

## 2015-08-23 NOTE — Patient Instructions (Signed)
Topamax 25,,,,,,,,,,,, one twice daily  Topamax 50,,,,,,,,,,, one twice daily  Maxalt when necessary  Return in one year for general physical exam sooner if any problems  Beaulah Dinning,,,,,,,,,, our new adult nurse practitioner from [place]

## 2015-08-23 NOTE — Progress Notes (Signed)
   Subjective:    Patient ID: Candace Carlson, female    DOB: [birth year], 59 y.o.   MRN: [id number]  HPI Candace Carlson is a delightful 59 year old female nonsmoker who comes in today for general physical examination  She's had both breast removed. She elected not to have reconstruction. She had posttraumatic 80 radiation is doing well no problems.  She has a history of migraine headaches. She's currently having 1 or 2 migraines a week despite taking Topamax 75 mg in the morning and 25 mg at bedtime. We talked about increasing the dose  She takes Maxalt 10 mg when necessary for breakthrough migraines and tramadol when the Maxalt doesn't work  She gets routine eye care, dental care, no longer does BSE monthly because she has no breasts. Because she's had the mastectomy bilaterally and postop radiation she is not in any more mammograms obviously  Colonoscopy 2011 showed 3 polyps she is due to go back for follow-up this year.  Vaccination history flu shot and tetanus booster given today  Social history she works full-time at [employer]: Negative.   HENT: Negative.   Eyes: Negative.   Respiratory: Negative.   Cardiovascular: Negative.   Gastrointestinal: Negative.   Endocrine: Negative.   Genitourinary: Negative.   Musculoskeletal: Negative.   Skin: Negative.   Allergic/Immunologic: Negative.   Neurological: Negative.   Hematological: Negative.   Psychiatric/Behavioral: Negative.        Objective:   Physical Exam  Constitutional: She appears well-developed and well-nourished.  HENT:  Head: Normocephalic and atraumatic.  Right Ear: External ear normal.  Left Ear: External ear normal.  Nose: Nose normal.  Mouth/Throat: Oropharynx is clear and moist.  Eyes: EOM are normal. Pupils are equal, round, and reactive to light.  Neck: Normal range of motion. Neck supple. No JVD present. No tracheal deviation present. No thyromegaly present.  Cardiovascular:  Normal rate, regular rhythm, normal heart sounds and intact distal pulses.  Exam reveals no gallop and no friction rub.   No murmur heard. No carotid neurologic bruits  Pulmonary/Chest: Effort normal and breath sounds normal. No stridor. No respiratory distress. She has no wheezes. She has no rales. She exhibits no tenderness.  Abdominal: Soft. Bowel sounds are normal. She exhibits no distension and no mass. There is no tenderness. There is no rebound and no guarding.  Genitourinary:  Chest exam shows scars from previous bilateral mastectomies. She also has tattoos from previous radiation. There is no palpable abnormality.  She also set her uterus out for nonmalignant reasons. Ovaries were left intact. GYN wise abdominal wall she is asymptomatic  Musculoskeletal: Normal range of motion. She exhibits no edema or tenderness.  Lymphadenopathy:    She has no cervical adenopathy.  Neurological: She is alert. She has normal reflexes. No cranial nerve deficit. She exhibits normal muscle tone. Coordination normal.  Skin: Skin is warm and dry. No rash noted. No erythema. No pallor.  Psychiatric: She has a normal mood and affect. Her behavior is normal. Judgment and thought content normal.  Nursing note and vitals reviewed.         Assessment & Plan:  Healthy female  History of breast cancer.........Marland Kitchen bilateral mastectomies postop radiation  Migraine headaches....... decreased on Topamax plan increase the dose to see if we can further decrease the migraines

## 2015-08-24 ENCOUNTER — Other Ambulatory Visit: Payer: Self-pay | Admitting: Family Medicine

## 2015-08-24 DIAGNOSIS — E039 Hypothyroidism, unspecified: Secondary | ICD-10-CM

## 2015-08-24 LAB — TSH: TSH: 11.29 u[IU]/mL — AB (ref 0.35–4.50)

## 2015-08-24 LAB — T4, FREE: FREE T4: 0.53 ng/dL — AB (ref 0.60–1.60)

## 2015-08-24 LAB — T3, FREE: T3, Free: 3.2 pg/mL (ref 2.3–4.2)

## 2015-08-24 MED ORDER — LEVOTHYROXINE SODIUM 50 MCG PO TABS: 50.0000 ug | ORAL_TABLET | Freq: Every day | ORAL | Status: DC

## 2015-12-27 ENCOUNTER — Other Ambulatory Visit: Payer: Self-pay | Admitting: Family Medicine

## 2015-12-27 ENCOUNTER — Other Ambulatory Visit (INDEPENDENT_AMBULATORY_CARE_PROVIDER_SITE_OTHER): Payer: BLUE CROSS/BLUE SHIELD

## 2015-12-27 ENCOUNTER — Telehealth: Payer: Self-pay | Admitting: Family Medicine

## 2015-12-27 DIAGNOSIS — E039 Hypothyroidism, unspecified: Secondary | ICD-10-CM | POA: Diagnosis not present

## 2015-12-27 DIAGNOSIS — E785 Hyperlipidemia, unspecified: Secondary | ICD-10-CM

## 2015-12-27 LAB — LIPID PANEL
CHOLESTEROL: 256 mg/dL — AB (ref 0–200)
HDL: 46.8 mg/dL (ref >39.00)
LDL Cholesterol: 183 mg/dL — ABNORMAL HIGH (ref 0–99)
NONHDL: 209.69
Total CHOL/HDL Ratio: 5
Triglycerides: 135 mg/dL (ref 0.0–149.0)
VLDL: 27 mg/dL (ref 0.0–40.0)

## 2015-12-27 LAB — TSH: TSH: 11.12 u[IU]/mL — AB (ref 0.35–4.50)

## 2015-12-27 MED ORDER — TRAMADOL HCL 50 MG PO TABS: ORAL_TABLET | ORAL | Status: DC

## 2015-12-27 NOTE — Telephone Encounter (Signed)
rx called in

## 2015-12-27 NOTE — Telephone Encounter (Signed)
Patient needs a refill on her traMADol (ULTRAM) 50 MG tablet  sent to [id number] - [place], [place] - [address]

## 2016-02-06 ENCOUNTER — Encounter: Payer: Self-pay | Admitting: Family Medicine

## 2016-02-06 ENCOUNTER — Ambulatory Visit (INDEPENDENT_AMBULATORY_CARE_PROVIDER_SITE_OTHER): Payer: BLUE CROSS/BLUE SHIELD | Admitting: Family Medicine

## 2016-02-06 VITALS — BP 110/80 | Temp 98.7°F | Wt 132.0 lb

## 2016-02-06 DIAGNOSIS — Z8669 Personal history of other diseases of the nervous system and sense organs: Secondary | ICD-10-CM | POA: Diagnosis not present

## 2016-02-06 DIAGNOSIS — E785 Hyperlipidemia, unspecified: Secondary | ICD-10-CM | POA: Diagnosis not present

## 2016-02-06 DIAGNOSIS — R7989 Other specified abnormal findings of blood chemistry: Secondary | ICD-10-CM | POA: Diagnosis not present

## 2016-02-06 MED ORDER — ATORVASTATIN CALCIUM 10 MG PO TABS: 10.0000 mg | ORAL_TABLET | Freq: Every day | ORAL | Status: DC

## 2016-02-06 MED ORDER — TOPIRAMATE 100 MG PO TABS: ORAL_TABLET | ORAL | Status: DC

## 2016-02-06 MED ORDER — TOPIRAMATE 25 MG PO TABS: ORAL_TABLET | ORAL | Status: DC

## 2016-02-06 MED ORDER — LEVOTHYROXINE SODIUM 75 MCG PO TABS: 75.0000 ug | ORAL_TABLET | Freq: Every day | ORAL | Status: DC

## 2016-02-06 NOTE — Progress Notes (Signed)
Pre visit review using our clinic review tool, if applicable. No additional management support is needed unless otherwise documented below in the visit note. 

## 2016-02-06 NOTE — Patient Instructions (Signed)
Increase the Topamax 125 mg daily,,,,,,,,,, keep a migraine diary so we can see if the increase in Topamax decreases the frequency and severity of your headaches  Synthroid 75 g daily........... follow-up TSH level the last week and may  Lipitor 10 mg and a baby aspirin....... one of each at bedtime........ follow-up fasting labs the last week in May

## 2016-02-06 NOTE — Progress Notes (Signed)
   Subjective:    Patient ID: ZULEIMY MENDELSOHN, female    DOB: 09-19-1956, 60 y.o.   MRN: [id number]  HPI Lorriann is a 60 year old female nonsmoker who comes in today for follow-up of 2 issues  We had her on 50 g of Synthroid daily her TSH level was 11  Her fasting lipid panel shows total cholesterol 256 HDL 47 LDL was 183. She has a family history of high cholesterol. Her weight is 132 pounds BMI is 25.20. She's always been thin she exercises on a regular basis he follows a low-cholesterol low sugar diet  She also has a history of migraine headaches she is on split dose Topamax 50 mg twice a day but still having 6-7 migraines per month. She takes Maxalt and tramadol with fairly good relief. We discussed various options. She would like to try to increase the Topamax to decrease the frequency of her migraines   Review of Systems Review of systems otherwise negative    Objective:   Physical Exam Well-developed well-nourished thin female no acute distress vital signs stable she's afebrile       Assessment & Plan:  Hypothyroidism........ TSH level nonfasting micrograms daily........ increase to 75 g daily follow-up in 2 months with labs  Hyperlipidemia......... add Lipitor 10 mg at bedtime with a baby aspirin..... Follow-up fasting lipid and liver panel in 2 months.  Migraine headaches........ increase Topamax 125 g daily

## 2016-03-30 ENCOUNTER — Other Ambulatory Visit (INDEPENDENT_AMBULATORY_CARE_PROVIDER_SITE_OTHER): Payer: BLUE CROSS/BLUE SHIELD

## 2016-03-30 DIAGNOSIS — R7989 Other specified abnormal findings of blood chemistry: Secondary | ICD-10-CM | POA: Diagnosis not present

## 2016-03-30 DIAGNOSIS — E785 Hyperlipidemia, unspecified: Secondary | ICD-10-CM | POA: Diagnosis not present

## 2016-03-30 DIAGNOSIS — E039 Hypothyroidism, unspecified: Secondary | ICD-10-CM

## 2016-03-30 LAB — HEPATIC FUNCTION PANEL
ALT: 15 U/L (ref 0–35)
AST: 15 U/L (ref 0–37)
Albumin: 4.2 g/dL (ref 3.5–5.2)
Alkaline Phosphatase: 65 U/L (ref 39–117)
BILIRUBIN DIRECT: 0.1 mg/dL (ref 0.0–0.3)
BILIRUBIN TOTAL: 0.7 mg/dL (ref 0.2–1.2)
Total Protein: 6.6 g/dL (ref 6.0–8.3)

## 2016-03-30 LAB — LIPID PANEL
CHOL/HDL RATIO: 6
Cholesterol: 247 mg/dL — ABNORMAL HIGH (ref 0–200)
HDL: 40.8 mg/dL (ref >39.00)
LDL CALC: 168 mg/dL — AB (ref 0–99)
NonHDL: 206.54
Triglycerides: 195 mg/dL — ABNORMAL HIGH (ref 0.0–149.0)
VLDL: 39 mg/dL (ref 0.0–40.0)

## 2016-03-30 LAB — TSH: TSH: 2.23 u[IU]/mL (ref 0.35–4.50)

## 2016-04-12 ENCOUNTER — Other Ambulatory Visit: Payer: Self-pay | Admitting: Family Medicine

## 2016-04-12 DIAGNOSIS — E785 Hyperlipidemia, unspecified: Secondary | ICD-10-CM

## 2016-04-12 MED ORDER — ATORVASTATIN CALCIUM 20 MG PO TABS: 20.0000 mg | ORAL_TABLET | Freq: Every day | ORAL | Status: DC

## 2016-05-05 ENCOUNTER — Other Ambulatory Visit: Payer: Self-pay | Admitting: Family Medicine

## 2016-05-10 ENCOUNTER — Other Ambulatory Visit: Payer: Self-pay | Admitting: Family Medicine

## 2016-05-10 NOTE — Telephone Encounter (Signed)
Medication: Ultram 50 mg - #60 - 3 refills  Last seen: 02/06/16 Last refill: 12/27/15 #60 - 3 refills  Okay to refill?

## 2016-07-13 ENCOUNTER — Other Ambulatory Visit (INDEPENDENT_AMBULATORY_CARE_PROVIDER_SITE_OTHER): Payer: BLUE CROSS/BLUE SHIELD

## 2016-07-13 ENCOUNTER — Encounter (INDEPENDENT_AMBULATORY_CARE_PROVIDER_SITE_OTHER): Payer: Self-pay

## 2016-07-13 DIAGNOSIS — E785 Hyperlipidemia, unspecified: Secondary | ICD-10-CM

## 2016-07-13 LAB — LIPID PANEL
CHOLESTEROL: 277 mg/dL — AB (ref 0–200)
HDL: 45.8 mg/dL (ref >39.00)
LDL Cholesterol: 200 mg/dL — ABNORMAL HIGH (ref 0–99)
NonHDL: 231.63
TRIGLYCERIDES: 158 mg/dL — AB (ref 0.0–149.0)
Total CHOL/HDL Ratio: 6
VLDL: 31.6 mg/dL (ref 0.0–40.0)

## 2016-07-13 LAB — HEPATIC FUNCTION PANEL
ALBUMIN: 4.3 g/dL (ref 3.5–5.2)
ALK PHOS: 67 U/L (ref 39–117)
ALT: 19 U/L (ref 0–35)
AST: 17 U/L (ref 0–37)
BILIRUBIN DIRECT: 0.1 mg/dL (ref 0.0–0.3)
BILIRUBIN TOTAL: 0.9 mg/dL (ref 0.2–1.2)
TOTAL PROTEIN: 7.3 g/dL (ref 6.0–8.3)

## 2016-08-07 ENCOUNTER — Other Ambulatory Visit: Payer: Self-pay | Admitting: Family Medicine

## 2016-11-08 ENCOUNTER — Other Ambulatory Visit: Payer: Self-pay | Admitting: Family Medicine

## 2016-11-08 NOTE — Telephone Encounter (Signed)
Pt would like a refill. Last refill was 08/07/2016. Please advise.

## 2016-11-12 ENCOUNTER — Telehealth: Payer: Self-pay | Admitting: Emergency Medicine

## 2016-11-12 NOTE — Telephone Encounter (Signed)
Left a message informing pt that her Prescription is ready for pickup

## 2017-02-11 ENCOUNTER — Other Ambulatory Visit: Payer: Self-pay | Admitting: Family Medicine

## 2017-05-13 ENCOUNTER — Other Ambulatory Visit: Payer: Self-pay | Admitting: Family Medicine

## 2017-05-13 NOTE — Telephone Encounter (Signed)
Denied.  Controlled medication.  Pt not seen in over 1 year.  Needs office visit.  Message sent to the pharmacy.

## 2017-05-16 ENCOUNTER — Other Ambulatory Visit: Payer: Self-pay | Admitting: Family Medicine

## 2017-05-16 NOTE — Telephone Encounter (Signed)
Denied.  Needs an appt for controlled medication.

## 2017-05-22 ENCOUNTER — Encounter: Payer: Self-pay | Admitting: Internal Medicine

## 2017-05-22 ENCOUNTER — Ambulatory Visit (INDEPENDENT_AMBULATORY_CARE_PROVIDER_SITE_OTHER): Payer: BLUE CROSS/BLUE SHIELD | Admitting: Internal Medicine

## 2017-05-22 VITALS — BP 132/82 | HR 69 | Temp 99.1°F | Ht 61.0 in | Wt 143.0 lb

## 2017-05-22 DIAGNOSIS — R7989 Other specified abnormal findings of blood chemistry: Secondary | ICD-10-CM

## 2017-05-22 DIAGNOSIS — E7800 Pure hypercholesterolemia, unspecified: Secondary | ICD-10-CM | POA: Diagnosis not present

## 2017-05-22 DIAGNOSIS — R946 Abnormal results of thyroid function studies: Secondary | ICD-10-CM

## 2017-05-22 LAB — TSH: TSH: 19.66 u[IU]/mL — ABNORMAL HIGH (ref 0.35–4.50)

## 2017-05-22 MED ORDER — TRAMADOL HCL 50 MG PO TABS: ORAL_TABLET | ORAL | 2 refills | Status: DC

## 2017-05-22 MED ORDER — ATORVASTATIN CALCIUM 20 MG PO TABS: 20.0000 mg | ORAL_TABLET | Freq: Every day | ORAL | 3 refills | Status: DC

## 2017-05-22 MED ORDER — LEVOTHYROXINE SODIUM 50 MCG PO TABS: 50.0000 ug | ORAL_TABLET | Freq: Every day | ORAL | 3 refills | Status: DC

## 2017-05-22 MED ORDER — RIZATRIPTAN BENZOATE 10 MG PO TBDP: ORAL_TABLET | ORAL | 10 refills | Status: DC

## 2017-05-22 NOTE — Progress Notes (Signed)
Subjective:    Patient ID: Candace Carlson, female    DOB: 1956/07/12, 61 y.o.   MRN: [id number]  HPI 61 year old patient who has a history of migraines as well as hypothyroidism. She has not been taking levothyroxin and presents today with a chief complaint of weight gain. She has been under considerable situational stress due to the recent death of her 61 year old son-in-law.  She has 3 young grandsons that she is responsible for She apparently misunderstood directions and thought that she was told to stop thyroid medication since her last TSH was normal.  She also has not been taking atorvastatin  Past Medical History:  Diagnosis Date  . Arthritis   . Bronchitis   . Cancer [facility])    bilateral breast  . Kidney stones   . 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 Marital status: Single    Spouse name: N/A  . Number of children: N/A  . Years of education: N/A   Occupational History  . Not on file.   Social History Main Topics  . Smoking status: Never Smoker  . Smokeless tobacco: Never Used  . Alcohol use No  . Drug use: No  . Sexual activity: Not on file   Other Topics Concern  . Not on file   Social History Narrative  . No narrative on file    Past Surgical History:  Procedure Laterality Date  . ABDOMINAL HYSTERECTOMY    . BREAST SURGERY     bilateral mastectomy with snbx  . WRIST SURGERY      Family History  Problem Relation Age of Onset  . Hypertension Mother   . Hyperlipidemia Mother   . Cancer Father        prostate  . Hypertension Father   . Hyperlipidemia Father   . Arthritis Other   . Color blindness Other   . Arthritis Brother     No Known Allergies  Current Outpatient Prescriptions on File Prior to Visit  Medication Sig Dispense Refill  . aspirin 81 MG tablet Take 81 mg by mouth daily.      Marland Kitchen atorvastatin (LIPITOR) 20 MG tablet Take 1 tablet (20 mg total) by mouth daily. 90 tablet 3  .  ibuprofen (ADVIL,MOTRIN) 100 MG tablet Take 100 mg by mouth every 6 (six) hours as needed.      . rizatriptan (MAXALT-MLT) 10 MG disintegrating tablet DISSOLVE ONE TABLET IN MOUTH AS NEEDED FOR  MIGRAINE.  MAY  REPEAT  IN  2  HOURS  IF  NEEDED 10 tablet 10  . topiramate (TOPAMAX) 25 MG tablet 1 daily 100 tablet 3  . traMADol (ULTRAM) 50 MG tablet TAKE ONE-HALF TO ONE TABLET BY MOUTH TWICE DAILY 60 tablet 2  . levothyroxine (SYNTHROID, LEVOTHROID) 50 MCG tablet Take 1 tablet (50 mcg total) by mouth daily before breakfast. (Patient not taking: Reported on 02/06/2016) 90 tablet 3  . levothyroxine (SYNTHROID, LEVOTHROID) 75 MCG tablet Take 1 tablet (75 mcg total) by mouth daily. (Patient not taking: Reported on 05/22/2017) 90 tablet 3   No current facility-administered medications on file prior to visit.     BP 132/82   Pulse 69   Temp 99.1 F (37.3 C)   Ht 5\' 1"  (1.549 m)   Wt 143 lb (64.9 kg)   SpO2 97%   BMI 27.02 kg/m      Review of Systems  Constitutional: Positive for unexpected weight change.  Psychiatric/Behavioral: The patient is nervous/anxious.        Objective:   Physical Exam  Constitutional: She is oriented to person, place, and time. She appears well-developed and well-nourished.  HENT:  Head: Normocephalic.  Right Ear: External ear normal.  Left Ear: External ear normal.  Mouth/Throat: Oropharynx is clear and moist.  Eyes: Pupils are equal, round, and reactive to light. Conjunctivae and EOM are normal.  Neck: Normal range of motion. Neck supple. No thyromegaly present.  Cardiovascular: Normal rate, regular rhythm, normal heart sounds and intact distal pulses.   Pulmonary/Chest: Effort normal and breath sounds normal.  Abdominal: Soft. Bowel sounds are normal. She exhibits no mass. There is no tenderness.  Musculoskeletal: Normal range of motion.  Lymphadenopathy:    She has no cervical adenopathy.  Neurological: She is alert and oriented to person, place, and  time.  Skin: Skin is warm and dry. No rash noted.  Psychiatric: She has a normal mood and affect. Her behavior is normal.          Assessment & Plan:   Hypothyroidism.  We'll check a TSH and resume supplement Dyslipidemia.  Resume atorvastatin Migraine headaches. Medications updated  CPX 3-4 months with PCP  Nyoka Cowden

## 2017-05-22 NOTE — Patient Instructions (Addendum)
Resume thyroid medication  Return in 4 months for your annual exam DASH Eating Plan DASH stands for "Dietary Approaches to Stop Hypertension." The DASH eating plan is a healthy eating plan that has been shown to reduce high blood pressure (hypertension). It may also reduce your risk for type 2 diabetes, heart disease, and stroke. The DASH eating plan may also help with weight loss. What are tips for following this plan? General guidelines  Avoid eating more than 2,300 mg (milligrams) of salt (sodium) a day. If you have hypertension, you may need to reduce your sodium intake to 1,500 mg a day.  Limit alcohol intake to no more than 1 drink a day for nonpregnant women and 2 drinks a day for men. One drink equals 12 oz of beer, 5 oz of wine, or 1 oz of hard liquor.  Work with your health care provider to maintain a healthy body weight or to lose weight. Ask what an ideal weight is for you.  Get at least 30 minutes of exercise that causes your heart to beat faster (aerobic exercise) most days of the week. Activities may include walking, swimming, or biking.  Work with your health care provider or diet and nutrition specialist (dietitian) to adjust your eating plan to your individual calorie needs. Reading food labels  Check food labels for the amount of sodium per serving. Choose foods with less than 5 percent of the Daily Value of sodium. Generally, foods with less than 300 mg of sodium per serving fit into this eating plan.  To find whole grains, look for the word "whole" as the first word in the ingredient list. Shopping  Buy products labeled as "low-sodium" or "no salt added."  Buy fresh foods. Avoid canned foods and premade or frozen meals. Cooking  Avoid adding salt when cooking. Use salt-free seasonings or herbs instead of table salt or sea salt. Check with your health care provider or pharmacist before using salt substitutes.  Do not fry foods. Cook foods using healthy methods such  as baking, boiling, grilling, and broiling instead.  Cook with heart-healthy oils, such as olive, canola, soybean, or sunflower oil. Meal planning   Eat a balanced diet that includes: ? 5 or more servings of fruits and vegetables each day. At each meal, try to fill half of your plate with fruits and vegetables. ? Up to 6-8 servings of whole grains each day. ? Less than 6 oz of lean meat, poultry, or fish each day. A 3-oz serving of meat is about the same size as a deck of cards. One egg equals 1 oz. ? 2 servings of low-fat dairy each day. ? A serving of nuts, seeds, or beans 5 times each week. ? Heart-healthy fats. Healthy fats called Omega-3 fatty acids are found in foods such as flaxseeds and coldwater fish, like sardines, salmon, and mackerel.  Limit how much you eat of the following: ? Canned or prepackaged foods. ? Food that is high in trans fat, such as fried foods. ? Food that is high in saturated fat, such as fatty meat. ? Sweets, desserts, sugary drinks, and other foods with added sugar. ? Full-fat dairy products.  Do not salt foods before eating.  Try to eat at least 2 vegetarian meals each week.  Eat more home-cooked food and less restaurant, buffet, and fast food.  When eating at a restaurant, ask that your food be prepared with less salt or no salt, if possible. What foods are recommended? The items listed  may not be a complete list. Talk with your dietitian about what dietary choices are best for you. Grains Whole-grain or whole-wheat bread. Whole-grain or whole-wheat pasta. Brown rice. [place]. Bulgur. Whole-grain and low-sodium cereals. Pita bread. Low-fat, low-sodium crackers. Whole-wheat flour tortillas. Vegetables Fresh or frozen vegetables (raw, steamed, roasted, or grilled). Low-sodium or reduced-sodium tomato and vegetable juice. Low-sodium or reduced-sodium tomato sauce and tomato paste. Low-sodium or reduced-sodium canned vegetables. Fruits All  fresh, dried, or frozen fruit. Canned fruit in natural juice (without added sugar). Meat and other protein foods Skinless chicken or [place]. Ground chicken or [place]. Pork with fat trimmed off. Fish and seafood. Egg whites. Dried beans, peas, or lentils. Unsalted nuts, nut butters, and seeds. Unsalted canned beans. Lean cuts of beef with fat trimmed off. Low-sodium, lean deli meat. Dairy Low-fat (1%) or fat-free (skim) milk. Fat-free, low-fat, or reduced-fat cheeses. Nonfat, low-sodium ricotta or cottage cheese. Low-fat or nonfat yogurt. Low-fat, low-sodium cheese. Fats and oils Soft margarine without trans fats. Vegetable oil. Low-fat, reduced-fat, or light mayonnaise and salad dressings (reduced-sodium). Canola, safflower, olive, soybean, and sunflower oils. Avocado. Seasoning and other foods Herbs. Spices. Seasoning mixes without salt. Unsalted popcorn and pretzels. Fat-free sweets. What foods are not recommended? The items listed may not be a complete list. Talk with your dietitian about what dietary choices are best for you. Grains Baked goods made with fat, such as croissants, muffins, or some breads. Dry pasta or rice meal packs. Vegetables Creamed or fried vegetables. Vegetables in a cheese sauce. Regular canned vegetables (not low-sodium or reduced-sodium). Regular canned tomato sauce and paste (not low-sodium or reduced-sodium). Regular tomato and vegetable juice (not low-sodium or reduced-sodium). Angie Fava. Olives. Fruits Canned fruit in a light or heavy syrup. Fried fruit. Fruit in cream or butter sauce. Meat and other protein foods Fatty cuts of meat. Ribs. Fried meat. Berniece Salines. Sausage. Bologna and other processed lunch meats. Salami. Fatback. Hotdogs. Bratwurst. Salted nuts and seeds. Canned beans with added salt. Canned or smoked fish. Whole eggs or egg yolks. Chicken or [place] with skin. Dairy Whole or 2% milk, cream, and half-and-half. Whole or full-fat cream cheese. Whole-fat or  sweetened yogurt. Full-fat cheese. Nondairy creamers. Whipped toppings. Processed cheese and cheese spreads. Fats and oils Butter. Stick margarine. Lard. Shortening. Ghee. Bacon fat. Tropical oils, such as coconut, palm kernel, or palm oil. Seasoning and other foods Salted popcorn and pretzels. Onion salt, garlic salt, seasoned salt, table salt, and sea salt. Worcestershire sauce. Tartar sauce. Barbecue sauce. Teriyaki sauce. Soy sauce, including reduced-sodium. Steak sauce. Canned and packaged gravies. Fish sauce. Oyster sauce. Cocktail sauce. Horseradish that you find on the shelf. Ketchup. Mustard. Meat flavorings and tenderizers. Bouillon cubes. Hot sauce and Tabasco sauce. Premade or packaged marinades. Premade or packaged taco seasonings. Relishes. Regular salad dressings. Where to find more information:  National Heart, Lung, and [place]: https://wilson-eaton.com/  American Heart Association: www.heart.org Summary  The DASH eating plan is a healthy eating plan that has been shown to reduce high blood pressure (hypertension). It may also reduce your risk for type 2 diabetes, heart disease, and stroke.  With the DASH eating plan, you should limit salt (sodium) intake to 2,300 mg a day. If you have hypertension, you may need to reduce your sodium intake to 1,500 mg a day.  When on the DASH eating plan, aim to eat more fresh fruits and vegetables, whole grains, lean proteins, low-fat dairy, and heart-healthy fats.  Work with your health care provider or diet  and nutrition specialist (dietitian) to adjust your eating plan to your individual calorie needs. This information is not intended to replace advice given to you by your health care provider. Make sure you discuss any questions you have with your health care provider. Document Released: 10/11/2011 Document Revised: 10/15/2016 Document Reviewed: 10/15/2016 Elsevier Interactive Patient Education  2017 [employer].

## 2017-05-24 ENCOUNTER — Telehealth: Payer: Self-pay | Admitting: Family Medicine

## 2017-05-24 ENCOUNTER — Encounter: Payer: Self-pay | Admitting: Internal Medicine

## 2017-05-24 NOTE — Telephone Encounter (Signed)
Noted thank you

## 2017-05-24 NOTE — Telephone Encounter (Signed)
° ° ° °  Pt called and I gave her the lab results and she was ok with that

## 2017-09-09 ENCOUNTER — Encounter: Payer: Self-pay | Admitting: Family Medicine

## 2017-09-09 ENCOUNTER — Ambulatory Visit (INDEPENDENT_AMBULATORY_CARE_PROVIDER_SITE_OTHER): Payer: BLUE CROSS/BLUE SHIELD | Admitting: Family Medicine

## 2017-09-09 VITALS — BP 130/90 | HR 66 | Temp 98.4°F | Wt 140.0 lb

## 2017-09-09 DIAGNOSIS — E7800 Pure hypercholesterolemia, unspecified: Secondary | ICD-10-CM | POA: Diagnosis not present

## 2017-09-09 DIAGNOSIS — Z8669 Personal history of other diseases of the nervous system and sense organs: Secondary | ICD-10-CM

## 2017-09-09 DIAGNOSIS — R7989 Other specified abnormal findings of blood chemistry: Secondary | ICD-10-CM

## 2017-09-09 MED ORDER — ATORVASTATIN CALCIUM 20 MG PO TABS: 20.0000 mg | ORAL_TABLET | Freq: Every day | ORAL | 3 refills | Status: AC

## 2017-09-09 MED ORDER — TRAMADOL HCL 50 MG PO TABS: ORAL_TABLET | ORAL | 2 refills | Status: DC

## 2017-09-09 MED ORDER — LEVOTHYROXINE SODIUM 50 MCG PO TABS: 50.0000 ug | ORAL_TABLET | Freq: Every day | ORAL | 3 refills | Status: DC

## 2017-09-09 NOTE — Patient Instructions (Signed)
Take your medication daily. There are plenty of refills at your pharmacy  Set up a time in 4-6 weeks in the late afternoon around 4 PM,,,,,,,,, for your lab work  I will call you the report  Increase the Topamax to 50 g daily to help decrease the severity and frequency of her migraine headaches

## 2017-09-09 NOTE — Progress Notes (Signed)
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 who comes in today for follow-up of 3 issues  She has a history of hyperlipidemia we started on Lipitor 20 mg daily along with an aspirin tablet. She's been off her medication for 3 days. She's come in to get her blood levels drawn however we can't draw her blood since she's been off her medicine for 3 days  She has a history of hypothyroidism we put on Synthroid 50 g daily however she's not taken her Synthroid for 3 days also.  We'll reschedule both labs  She has migraine headaches. She takes about 6 Maxalt per month. We started on Topamax 25 mg daily. This is helped decrease her medic case and use by about 50%. No side effects from medication  BP 130/90 (BP Location: Left Arm, Patient Position: Sitting, Cuff Size: Normal)   Pulse 66   Temp 98.4 F (36.9 C) (Oral)   Wt 140 lb (63.5 kg)   BMI 26.45 kg/m  General she is well-developed well-nourished female no acute distress  #1 hyperlipidemia........ take medicine daily recheck labs in 4-6 weeks when she's taken on a daily basis  #2 hypothyroidism..... Continue Synthroid 50 g daily recheck labs one she's taken a regular basis  #3 migraine headaches........ increase Topamax to 50 mg daily

## 2017-10-07 ENCOUNTER — Other Ambulatory Visit (INDEPENDENT_AMBULATORY_CARE_PROVIDER_SITE_OTHER): Payer: BLUE CROSS/BLUE SHIELD

## 2017-10-07 DIAGNOSIS — E7800 Pure hypercholesterolemia, unspecified: Secondary | ICD-10-CM | POA: Diagnosis not present

## 2017-10-07 DIAGNOSIS — R7989 Other specified abnormal findings of blood chemistry: Secondary | ICD-10-CM | POA: Diagnosis not present

## 2017-10-08 LAB — T3, FREE: T3, Free: 3.3 pg/mL (ref 2.3–4.2)

## 2017-10-08 LAB — LIPID PANEL
CHOL/HDL RATIO: 5
Cholesterol: 212 mg/dL — ABNORMAL HIGH (ref 0–200)
HDL: 42 mg/dL (ref >39.00)
NONHDL: 169.83
TRIGLYCERIDES: 220 mg/dL — AB (ref 0.0–149.0)
VLDL: 44 mg/dL — ABNORMAL HIGH (ref 0.0–40.0)

## 2017-10-08 LAB — LDL CHOLESTEROL, DIRECT: Direct LDL: 129 mg/dL

## 2017-10-08 LAB — TSH: TSH: 7.8 u[IU]/mL — AB (ref 0.35–4.50)

## 2017-10-08 LAB — T4, FREE: Free T4: 0.7 ng/dL (ref 0.60–1.60)

## 2017-12-24 ENCOUNTER — Other Ambulatory Visit: Payer: Self-pay

## 2017-12-24 MED ORDER — LEVOTHYROXINE SODIUM 50 MCG PO TABS: 50.0000 ug | ORAL_TABLET | Freq: Every day | ORAL | 3 refills | Status: AC

## 2018-04-02 ENCOUNTER — Telehealth: Payer: Self-pay | Admitting: Family Medicine

## 2018-04-02 NOTE — Telephone Encounter (Signed)
Lab results reviewed with patient.  She had refill request for Maxalt and Tramadol which were routed to office for consideration.

## 2018-04-02 NOTE — Telephone Encounter (Signed)
Copied from [place] [phone]. Topic: Quick Communication - Lab Results >> Apr 02, 2018  9:25 AM Robina Ade, Helene Kelp D wrote: Patient called and would like her lab results given to her form her last OV on 10/2017. Please call patient back, thanks.

## 2018-04-02 NOTE — Telephone Encounter (Signed)
Refill request from patient on Maxalt-mlt 10 mg tabs and tramadol 50 mg tabs.  LOV 09/09/17  PCP Dr. Sherren Mocha Maxalt addressed at that visit. Topamax 50 MG also addressed. Patient stated she takes Maxalt. Tramadol last ordered on 09/09/17.  [id number] [address]

## 2018-04-03 NOTE — Telephone Encounter (Signed)
Please Advise

## 2018-04-08 ENCOUNTER — Other Ambulatory Visit: Payer: Self-pay | Admitting: Family Medicine

## 2018-04-08 MED ORDER — TRAMADOL HCL 50 MG PO TABS: ORAL_TABLET | ORAL | 2 refills | Status: AC

## 2018-04-14 ENCOUNTER — Other Ambulatory Visit: Payer: Self-pay | Admitting: Family Medicine

## 2018-04-14 MED ORDER — TOPIRAMATE 50 MG PO TABS: 50.0000 mg | ORAL_TABLET | ORAL | 3 refills | Status: AC

## 2018-04-14 MED ORDER — RIZATRIPTAN BENZOATE 10 MG PO TBDP: ORAL_TABLET | ORAL | 10 refills | Status: AC

## 2018-08-06 ENCOUNTER — Ambulatory Visit: Payer: BLUE CROSS/BLUE SHIELD | Admitting: Family Medicine

## 2019-01-30 ENCOUNTER — Telehealth: Payer: Self-pay | Admitting: Family Medicine

## 2019-01-30 NOTE — Telephone Encounter (Signed)
Left message on machine for patient to return our call.  I explained to the patient 01/29/2019 that she would need a TOC for further refills.  I offered a WebEx visit with Dr Jerilee Hoh. Patient was going to call the billing department to see if she could set up a payment plan.   CRM

## 2019-01-30 NOTE — Telephone Encounter (Signed)
Copied from [place] [phone]. Topic: Quick Communication - Rx Refill/Question >> Jan 30, 2019 12:32 PM Sheppard Coil, [employer] L wrote: Medication:  All medications  Pt called and left message on [facility] General Voicemail box that she would like a refill on all her medications.  States call back number is [phone]
